# Patient Record
Sex: Female | Born: 1950
Health system: Southern US, Community
[De-identification: ages and names within clinical notes are randomized; demographics above are authoritative.]

## PROBLEM LIST (undated history)

## (undated) DIAGNOSIS — B019 Varicella without complication: Secondary | ICD-10-CM

## (undated) DIAGNOSIS — F17200 Nicotine dependence, unspecified, uncomplicated: Secondary | ICD-10-CM

## (undated) DIAGNOSIS — R112 Nausea with vomiting, unspecified: Secondary | ICD-10-CM

## (undated) DIAGNOSIS — Z9889 Other specified postprocedural states: Secondary | ICD-10-CM

## (undated) DIAGNOSIS — T7840XA Allergy, unspecified, initial encounter: Secondary | ICD-10-CM

## (undated) DIAGNOSIS — K635 Polyp of colon: Secondary | ICD-10-CM

## (undated) DIAGNOSIS — M79672 Pain in left foot: Secondary | ICD-10-CM

## (undated) DIAGNOSIS — T8859XA Other complications of anesthesia, initial encounter: Secondary | ICD-10-CM

## (undated) DIAGNOSIS — M419 Scoliosis, unspecified: Secondary | ICD-10-CM

## (undated) DIAGNOSIS — H269 Unspecified cataract: Secondary | ICD-10-CM

## (undated) DIAGNOSIS — E785 Hyperlipidemia, unspecified: Secondary | ICD-10-CM

## (undated) DIAGNOSIS — T4145XA Adverse effect of unspecified anesthetic, initial encounter: Secondary | ICD-10-CM

## (undated) DIAGNOSIS — Z5189 Encounter for other specified aftercare: Secondary | ICD-10-CM

## (undated) HISTORY — DX: Allergy, unspecified, initial encounter: T78.40XA

## (undated) HISTORY — PX: COLONOSCOPY: SHX174

## (undated) HISTORY — PX: CATARACT EXTRACTION, BILATERAL: SHX1313

## (undated) HISTORY — DX: Varicella without complication: B01.9

## (undated) HISTORY — DX: Hyperlipidemia, unspecified: E78.5

## (undated) HISTORY — PX: OTHER SURGICAL HISTORY: SHX169

## (undated) HISTORY — DX: Encounter for other specified aftercare: Z51.89

## (undated) HISTORY — DX: Unspecified cataract: H26.9

## (undated) HISTORY — DX: Nicotine dependence, unspecified, uncomplicated: F17.200

## (undated) HISTORY — PX: POLYPECTOMY: SHX149

## (undated) HISTORY — DX: Polyp of colon: K63.5

## (undated) HISTORY — PX: EYE SURGERY: SHX253

## (undated) HISTORY — DX: Scoliosis, unspecified: M41.9

## (undated) HISTORY — PX: WISDOM TOOTH EXTRACTION: SHX21

## (undated) HISTORY — DX: Pain in left foot: M79.672

---
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
## 2005-02-24 ENCOUNTER — Ambulatory Visit: Payer: Self-pay | Admitting: Internal Medicine

## 2005-03-30 ENCOUNTER — Ambulatory Visit: Payer: Self-pay | Admitting: Psychology

## 2005-04-23 ENCOUNTER — Ambulatory Visit: Payer: Self-pay | Admitting: Internal Medicine

## 2010-04-22 ENCOUNTER — Telehealth (INDEPENDENT_AMBULATORY_CARE_PROVIDER_SITE_OTHER): Payer: Self-pay | Admitting: *Deleted

## 2010-04-23 DIAGNOSIS — M79609 Pain in unspecified limb: Secondary | ICD-10-CM | POA: Insufficient documentation

## 2010-05-07 ENCOUNTER — Encounter: Payer: Self-pay | Admitting: Internal Medicine

## 2010-05-13 DIAGNOSIS — D126 Benign neoplasm of colon, unspecified: Secondary | ICD-10-CM

## 2010-05-13 DIAGNOSIS — E785 Hyperlipidemia, unspecified: Secondary | ICD-10-CM

## 2010-05-13 DIAGNOSIS — J449 Chronic obstructive pulmonary disease, unspecified: Secondary | ICD-10-CM

## 2010-05-13 DIAGNOSIS — M4120 Other idiopathic scoliosis, site unspecified: Secondary | ICD-10-CM | POA: Insufficient documentation

## 2010-05-15 ENCOUNTER — Ambulatory Visit: Payer: Self-pay | Admitting: Internal Medicine

## 2010-05-15 LAB — CONVERTED CEMR LAB
ALT: 21 units/L (ref 0–35)
Alkaline Phosphatase: 58 units/L (ref 39–117)
BUN: 11 mg/dL (ref 6–23)
Basophils Absolute: 0.1 10*3/uL (ref 0.0–0.1)
Basophils Relative: 0.9 % (ref 0.0–3.0)
CO2: 31 meq/L (ref 19–32)
Cholesterol: 248 mg/dL — ABNORMAL HIGH (ref 0–200)
Eosinophils Absolute: 0.1 10*3/uL (ref 0.0–0.7)
Eosinophils Relative: 1.2 % (ref 0.0–5.0)
GFR calc non Af Amer: 104.31 mL/min (ref >60)
HCT: 43.3 % (ref 36.0–46.0)
HDL: 59.9 mg/dL (ref >39.00)
Hemoglobin, Urine: NEGATIVE
Ketones, ur: NEGATIVE mg/dL
Lymphs Abs: 1.5 10*3/uL (ref 0.7–4.0)
MCHC: 34.2 g/dL (ref 30.0–36.0)
MCV: 93.1 fL (ref 78.0–100.0)
Monocytes Absolute: 0.6 10*3/uL (ref 0.1–1.0)
Neutro Abs: 4.6 10*3/uL (ref 1.4–7.7)
Nitrite: NEGATIVE
Potassium: 4.3 meq/L (ref 3.5–5.1)
Total Bilirubin: 0.7 mg/dL (ref 0.3–1.2)
Total CHOL/HDL Ratio: 4
Total Protein, Urine: NEGATIVE mg/dL
Urine Glucose: NEGATIVE mg/dL
WBC: 6.9 10*3/uL (ref 4.5–10.5)

## 2010-05-16 LAB — CONVERTED CEMR LAB: Vit D, 25-Hydroxy: 56 ng/mL (ref 30–89)

## 2010-11-03 NOTE — Assessment & Plan Note (Signed)
Summary: Primary svc/ cpx    Primary Provider/Referring Provider:  Sherene Sires  CC:  cpx fasting.  History of Present Illness: 60 yowf smoker no meds with h/lo AB seen in 2006   May 15, 2010  1st   office eval  in EMR just finishing meloxicam, not limited by sob or need for any resp rx even during uris.  Pt denies any significant sore throat, dysphagia, itching, sneezing,  nasal congestion or excess secretions,  fever, chills, sweats, unintended wt loss, pleuritic or exertional cp, hempoptysis, change in activity tolerance  orthopnea pnd or leg swelling     Current Medications (verified): 1)  Medroxyprogesterone Acetate 2.5 Mg Tabs (Medroxyprogesterone Acetate) .Marland Kitchen.. 1 Once Daily 2)  Estrace 0.5 Mg Tabs (Estradiol) .Marland Kitchen.. 1 Once Daily  Allergies (verified): 1)  ! Vicodin  Past History:  Past Medical History: p lap Cholecystectomy 11/27/1993 Active smoker     - PFTs 08/01/2003  FEV1 (93%) with ratio 71 [place]     - Rec dexa q 25 months May 15, 2010  Hyperlipidemia     - Target LDL < 130 Pos smoker, cva in father  L foot pain....................................................Marland KitchenVoytek    Health Maintenance.....................................Marland KitchenSherene Sires      - Colonscopy 2004 Medoff      - CPX May 15, 2010      - DT  2004      - Pneumovax 2004 and 2009   Family History: Stroke- Father (in his 47's) Lung ca mother  Social History: Current smoker since age 60. Smokes 1/2 ppd. ETOH- red wine 4 x per wk Divorced Sales rep, moslty sitting behind desk Reg ex  Vital Signs:  Patient profile:   60 year old female Height:      60.5 inches Weight:      145 pounds BMI:     27.95 O2 Sat:      99 % on Room air Temp:     98.8 degrees F oral Pulse rate:   82 / minute BP sitting:   124 / 86  (left arm)  Vitals Entered By: Vernie Murders (May 15, 2010 8:56 AM)  O2 Flow:  Room air  Physical Exam  Additional Exam:  wt 145 May 15, 2010 Ambulatory healthy appearing in no  acute distress. HEENT: nl dentition, turbinates, and orophanx. Nl external ear canals without cough reflex Neck without JVD/Nodes/TM Lungs clear to A and P bilaterally without cough on insp or exp maneuvers RRR no s3 or murmur or increase in P2 Abd soft and benign with nl excursion in the supine position. No bruits or organomegaly Ext warm without calf tenderness, cyanosis clubbing or edema Skin warm and dry without lesions      Cholesterol          [H]  248 mg/dL                   1-610     ATP III Classification            Desirable:  < 200 mg/dL                    Borderline High:  200 - 239 mg/dL               High:  > = 240 mg/dL   Triglycerides             109.0 mg/dL                 9.6-045.0  Normal:  <150 mg/dL     Borderline High:  161 - 199 mg/dL   HDL                       09.60 mg/dL                 >45.40   VLDL Cholesterol          21.8 mg/dL                  9.8-11.9  CHO/HDL Ratio:  CHD Risk                             4                    Men          Women     1/2 Average Risk     3.4          3.3     Average Risk          5.0          4.4     2X Average Risk          9.6          7.1     3X Average Risk          15.0          11.0                           Tests: (2) BMP (METABOL)   Sodium                    144 mEq/L                   135-145   Potassium                 4.3 mEq/L                   3.5-5.1   Chloride                  108 mEq/L                   96-112   Carbon Dioxide            31 mEq/L                    19-32   Glucose                   80 mg/dL                    14-78   BUN                       11 mg/dL                    2-95   Creatinine                0.6 mg/dL                   6.2-1.3   Calcium  9.5 mg/dL                   6.0-45.4   GFR                       104.31 mL/min               >60  Tests: (3) CBC Platelet w/Diff (CBCD)   White Cell Count          6.9 K/uL                    4.5-10.5   Red Cell Count             4.65 Mil/uL                 3.87-5.11   Hemoglobin                14.8 g/dL                   09.8-11.9   Hematocrit                43.3 %                      36.0-46.0   MCV                       93.1 fl                     78.0-100.0   MCHC                      34.2 g/dL                   14.7-82.9   RDW                       14.1 %                      11.5-14.6   Platelet Count            191.0 K/uL                  150.0-400.0   Neutrophil %              66.6 %                      43.0-77.0   Lymphocyte %              22.2 %                      12.0-46.0   Monocyte %                9.1 %                       3.0-12.0   Eosinophils%              1.2 %                       0.0-5.0   Basophils %               0.9 %  0.0-3.0   Neutrophill Absolute      4.6 K/uL                    1.4-7.7   Lymphocyte Absolute       1.5 K/uL                    0.7-4.0   Monocyte Absolute         0.6 K/uL                    0.1-1.0  Eosinophils, Absolute                             0.1 K/uL                    0.0-0.7   Basophils Absolute        0.1 K/uL                    0.0-0.1  Tests: (4) Hepatic/Liver Function Panel (HEPATIC)   Total Bilirubin           0.7 mg/dL                   1.4-7.8   Direct Bilirubin          0.1 mg/dL                   2.9-5.6   Alkaline Phosphatase      58 U/L                      39-117   AST                       20 U/L                      0-37   ALT                       21 U/L                      0-35   Total Protein             6.7 g/dL                    2.1-3.0   Albumin                   3.9 g/dL                    8.6-5.7  Tests: (5) TSH (TSH)   FastTSH                   1.50 uIU/mL                 0.35-5.50  Tests: (6) UDip Only (UDIP)   Color                     LT. YELLOW       RANGE:  Yellow;Lt. Yellow   Clarity                   CLEAR  Clear   Specific Gravity          1.010                        1.000 - 1.030   Urine Ph                  6.0                         5.0-8.0   Protein                   NEGATIVE                    Negative   Urine Glucose             NEGATIVE                    Negative   Ketones                   NEGATIVE                    Negative   Urine Bilirubin           NEGATIVE                    Negative   Blood                     NEGATIVE                    Negative   Urobilinogen              0.2                         0.0 - 1.0   Leukocyte Esterace        NEGATIVE                    Negative   Nitrite                   NEGATIVE                    Negative  Tests: (7) Cholesterol LDL - Direct (DIRLDL)  Cholesterol LDL - Direct                             176.0 mg/dL  CXR  Procedure date:  05/15/2010  Findings:      No active lung disease.  Stable scarring at the left lung base. Stable moderate thoracolumbar scoliosis.  Impression & Recommendations:  Problem # 1:  COPD, MILD (ICD-496) No pft's on file   I took this opportunity to emphasize  the consequences of smoking in airway disorders based on all the data we have from the multiple national lung health studies indicating that smoking cessation, not choice of inhalers or physicians, is the most important aspect of care.    Problem # 2:  HYPERLIPIDEMIA (ICD-272.4)   - Target LDL < 130 Pos smoker, cva in father   LDL 160 > 176 May 15, 2010   rec diet/ ex and recheck in 3 months  Problem # 3:  SCOLIOSIS (ICD-737.30) nees dexa every 25 months vit d level now  Problem #  4:  FOOT PAIN, LEFT (ICD-729.5) f/u voytek  Problem # 5:  Hx of COLONIC POLYPS (ICD-211.3) f/u medoff  Medications Added to Medication List This Visit: 1)  Medroxyprogesterone Acetate 2.5 Mg Tabs (Medroxyprogesterone acetate) .Marland Kitchen.. 1 once daily 2)  Estrace 0.5 Mg Tabs (Estradiol) .Marland Kitchen.. 1 once daily  Other Orders: EKG w/ Interpretation (93000) TLB-Lipid Panel (80061-LIPID) TLB-BMP (Basic Metabolic Panel-BMET)  (80048-METABOL) TLB-CBC Platelet - w/Differential (85025-CBCD) TLB-Hepatic/Liver Function Pnl (80076-HEPATIC) TLB-TSH (Thyroid Stimulating Hormone) (84443-TSH) T-Vitamin D (25-Hydroxy) (16109-60454) TLB-Udip ONLY (81003-UDIP) Gynecologic Referral (Gyn) Pneumococcal Vaccine (09811) Admin 1st Vaccine (91478) New Patient 40-64 years (29562) T-2 View CXR (71020TC)  Patient Instructions: 1)  See Patient Care Coordinator before leaving for refer to Dr Rosemary Holms 2)  Call [phone] for your results w/in next 3 days - if there's something important  I feel you need to know,  I'll be in touch with you directly.  3)  Baby aspirin one daily   CardioPerfect ECG  ID: [id number] Patient: WESTYN, KEATLEY DOB: 16-Jul-1951 Age: 60 Years Old Sex: Female Race: White Physician: Sandrea Hughs PCP: Sandrea Hughs Technician: Boone Master CNA/MA Height: 60.5 Weight: 145 Status: Unconfirmed Recorded: 05/15/2010 09:17 AM P/PR: 112 ms / 165 ms - Heart rate (maximum exercise) QRS: 100 QT/QTc/QTd: 400 ms / 426 ms / 39 ms - Heart rate (maximum exercise)  P/QRS/T axis: 59 deg / 70 deg / 44 deg - Heart rate (maximum exercise)  Heartrate: 75 bpm  Interpretation:   sinus rhythm   Normal ECG       Immunizations Administered:  Pneumonia Vaccine:    Vaccine Type: Pneumovax    Site: left deltoid    Mfr: Merck    Dose: 0.5 ml    Route: IM    Given by: Vernie Murders    Exp. Date: 10/21/2011    Lot #: [id number]    VIS given: 05/01/96 version given May 15, 2010.

## 2010-11-03 NOTE — Letter (Signed)
Summary: [facility]  [facility]   Imported By: Sherian Rein 05/14/2010 14:53:07  _____________________________________________________________________  External Attachment:    Type:   Image     Comment:   External Document

## 2010-11-03 NOTE — Progress Notes (Signed)
Summary: referral req  Phone Note Call from Patient   Caller: Patient Call For: wert Summary of Call: pt c/o of L foot pain / swollen toes/ not red in color however, x 2-3 wks. requests a referral to podiatrist. 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 wk [phone] or cell [phone] Initial call taken by: Tivis Ringer, CNA,  April 22, 2010 3:25 PM  Follow-up for Phone Call        called and spoke with pt ---she stated that she used to work here---she scheduled cpx for 8-12 at 9 am but wanted to know if she would be considered a new pt since her last ov was 2006???  also she has left foot pain x 2-3 weeks with some swelling--wearing a shoe is uncomfortable but she is able to walk.  does she need referral for podiatrist??  please advise. thanks Randell Loop CMA  April 22, 2010 3:38 PM  fine with me but prefer she see an orthopedist - Dr Charlett Blake has a special interest in feet and would be a good choice Follow-up by: Nyoka Cowden MD,  April 22, 2010 4:59 PM  Additional Follow-up for Phone Call Additional follow up Details #1::        LMTCB.Reynaldo Minium CMA  April 22, 2010 5:06 PM   patient returned call.  advised of MW's recs.  patient okay with MW's recommendation of Dr. Charlett Blake.  order placed in EMR. Boone Master CNA/MA  April 23, 2010 9:42 AM   New Problems: FOOT PAIN, LEFT (ICD-729.5)   New Problems: FOOT PAIN, LEFT (ICD-729.5)

## 2011-05-31 ENCOUNTER — Telehealth: Payer: Self-pay | Admitting: Internal Medicine

## 2011-05-31 NOTE — Telephone Encounter (Signed)
lmomtcb to advise Dr. Sherene Sires does not give the shingles but we can order it for her and she get the shot at the pharmacy

## 2011-06-01 NOTE — Telephone Encounter (Signed)
LMTCBx2. Pt needs to set an appt, last seen 05-2010, we can ask MW about giving her shingles rx. Carron Curie, CMA

## 2011-06-02 NOTE — Telephone Encounter (Signed)
lmomtcb  

## 2011-06-03 NOTE — Telephone Encounter (Signed)
Phone number [phone] is not the correct number for this patient according to the gentleman that answered the phone. I have left a msg on the home number that we have listed in the pt's chart.

## 2011-06-08 NOTE — Telephone Encounter (Signed)
Still no call back and so will close encounter per protocol.

## 2011-06-23 ENCOUNTER — Telehealth: Payer: Self-pay | Admitting: Internal Medicine

## 2011-06-23 NOTE — Telephone Encounter (Signed)
She needs appt here for CPX, last seen here 05/15/10- we should sched her appt and then she can discuss shingles vaccine at that time and obtain rx if he is okay with this. LMTCB.

## 2011-06-24 NOTE — Telephone Encounter (Signed)
I attempted to call pt work # listed and it states it was disconnected. i LMOMTCB at # above

## 2011-06-24 NOTE — Telephone Encounter (Signed)
Patient calling back.  Please call work # [phone]

## 2011-06-25 MED ORDER — VARICELLA VIRUS VACCINE LIVE 1350 PFU/0.5ML IJ SUSR: 0.5000 mL | Freq: Once | INTRAMUSCULAR | Status: AC

## 2011-06-25 NOTE — Telephone Encounter (Signed)
Ok with me since we don't give this shot anyway

## 2011-06-25 NOTE — Telephone Encounter (Signed)
Spoke with pt and advised overdue for ov, needs to sched appt. Pt refuses to sched appt stating that she "just can't come in any time soon" and wants [place] to give rx for shingles vaccine. I advised MW out of the office until 07-05-11 and we will forward him msg and contact her with response. She requests we call her work #. Please advise, thanks!

## 2011-06-25 NOTE — Telephone Encounter (Signed)
Let her know won't be back in office until Oct 1 will sign/ send then

## 2011-06-25 NOTE — Telephone Encounter (Signed)
Pt is aware and says the prescription needs to be dated within a week of the injection being given and this will be done on 07/13/2011. We will date the rx for 10/8 for the patient and mail this to her home address once MW sign rx. Home address was verified with the patient.  Prescription printed and placed in MW look at for signature. Envelope already addressed.

## 2011-06-28 ENCOUNTER — Telehealth: Payer: Self-pay | Admitting: Internal Medicine

## 2011-06-28 NOTE — Telephone Encounter (Signed)
Lawson Fiscal already dated rx for 07/12/11 b/c we knew it could not be over 1 wk old. Does she need [place] to redo it again? LMTCB

## 2011-06-28 NOTE — Telephone Encounter (Signed)
Pt aware will send rx to her once signed by MW.

## 2011-06-28 NOTE — Telephone Encounter (Signed)
LMTCB

## 2011-06-30 NOTE — Telephone Encounter (Signed)
lmomtcb  

## 2011-07-01 NOTE — Telephone Encounter (Signed)
lmomtcb  

## 2011-07-09 ENCOUNTER — Telehealth: Payer: Self-pay | Admitting: Internal Medicine

## 2011-07-09 NOTE — Telephone Encounter (Signed)
lmomtcb  

## 2011-07-12 NOTE — Telephone Encounter (Signed)
lmomtcb  

## 2011-07-13 NOTE — Telephone Encounter (Signed)
lmomctb  

## 2011-07-14 NOTE — Telephone Encounter (Signed)
Lmomtcb.  Per protocol, will sign off on this message as we have attempted to contact pt several times but have not been able to reach her.

## 2012-06-26 ENCOUNTER — Encounter: Payer: Self-pay | Admitting: Internal Medicine

## 2012-06-27 ENCOUNTER — Other Ambulatory Visit (INDEPENDENT_AMBULATORY_CARE_PROVIDER_SITE_OTHER): Payer: BC Managed Care – PPO

## 2012-06-27 ENCOUNTER — Ambulatory Visit (INDEPENDENT_AMBULATORY_CARE_PROVIDER_SITE_OTHER)
Admission: RE | Admit: 2012-06-27 | Discharge: 2012-06-27 | Disposition: A | Discharge status: Home / Self Care | Payer: BC Managed Care – PPO | Source: Ambulatory Visit | Attending: Internal Medicine | Admitting: Internal Medicine

## 2012-06-27 ENCOUNTER — Encounter: Payer: Self-pay | Admitting: Internal Medicine

## 2012-06-27 ENCOUNTER — Ambulatory Visit (INDEPENDENT_AMBULATORY_CARE_PROVIDER_SITE_OTHER): Payer: BC Managed Care – PPO | Admitting: Internal Medicine

## 2012-06-27 VITALS — BP 134/88 | HR 82 | Temp 97.8°F | Ht 60.25 in | Wt 131.8 lb

## 2012-06-27 DIAGNOSIS — E785 Hyperlipidemia, unspecified: Secondary | ICD-10-CM

## 2012-06-27 DIAGNOSIS — D126 Benign neoplasm of colon, unspecified: Secondary | ICD-10-CM

## 2012-06-27 DIAGNOSIS — J449 Chronic obstructive pulmonary disease, unspecified: Secondary | ICD-10-CM

## 2012-06-27 DIAGNOSIS — Z0000 Encounter for general adult medical examination without abnormal findings: Secondary | ICD-10-CM

## 2012-06-27 DIAGNOSIS — M4120 Other idiopathic scoliosis, site unspecified: Secondary | ICD-10-CM

## 2012-06-27 DIAGNOSIS — Z23 Encounter for immunization: Secondary | ICD-10-CM

## 2012-06-27 LAB — URINALYSIS
Bilirubin Urine: NEGATIVE
Hgb urine dipstick: NEGATIVE
Nitrite: NEGATIVE
Total Protein, Urine: NEGATIVE
Urine Glucose: NEGATIVE
pH: 5.5 (ref 5.0–8.0)

## 2012-06-27 LAB — CBC WITH DIFFERENTIAL/PLATELET
Basophils Absolute: 0.1 10*3/uL (ref 0.0–0.1)
Eosinophils Relative: 0.8 % (ref 0.0–5.0)
Lymphocytes Relative: 16.4 % (ref 12.0–46.0)
Monocytes Relative: 7.9 % (ref 3.0–12.0)
Neutrophils Relative %: 74.3 % (ref 43.0–77.0)
Platelets: 216 10*3/uL (ref 150.0–400.0)
RDW: 13.9 % (ref 11.5–14.6)
WBC: 9.1 10*3/uL (ref 4.5–10.5)

## 2012-06-27 LAB — BASIC METABOLIC PANEL
BUN: 12 mg/dL (ref 6–23)
Calcium: 9.5 mg/dL (ref 8.4–10.5)
GFR: 81.89 mL/min (ref >60.00)
Potassium: 4 mEq/L (ref 3.5–5.1)
Sodium: 141 mEq/L (ref 135–145)

## 2012-06-27 LAB — HEPATIC FUNCTION PANEL
ALT: 17 U/L (ref 0–35)
AST: 24 U/L (ref 0–37)
Bilirubin, Direct: 0.1 mg/dL (ref 0.0–0.3)
Total Bilirubin: 0.7 mg/dL (ref 0.3–1.2)

## 2012-06-27 LAB — LIPID PANEL
HDL: 53.7 mg/dL (ref >39.00)
Triglycerides: 94 mg/dL (ref 0.0–149.0)
VLDL: 18.8 mg/dL (ref 0.0–40.0)

## 2012-06-27 LAB — LDL CHOLESTEROL, DIRECT: Direct LDL: 164.1 mg/dL

## 2012-06-27 LAB — TSH: TSH: 1.37 u[IU]/mL (ref 0.35–5.50)

## 2012-06-27 NOTE — Patient Instructions (Addendum)
Stopping all smoking is the most important aspect of your health at this point - good luck!  Please see patient coordinator before you leave today  to schedule bone density through [facility]  Please remember to go to the lab and x-ray department downstairs for your tests - we will call you with the results when they are available.

## 2012-06-27 NOTE — Progress Notes (Deleted)
fds

## 2012-06-27 NOTE — Progress Notes (Signed)
Quick Note:  Spoke with pt and notified of results per Dr. Wert. Pt verbalized understanding and denied any questions.  ______ 

## 2012-06-27 NOTE — Assessment & Plan Note (Signed)
-   PFT's ok x min change in mid flows 06/27/2012   GOLD 0 still smoking > discussed.  I reviewed the Flethcher curve with patient that basically indicates  if you quit smoking when your best day FEV1 is still well preserved (as hers is) it is highly unlikely you will progress to severe disease and informed the patient there was no medication on the market that has proven to change the curve or the likelihood of progression.  Therefore stopping smoking and maintaining abstinence is the most important aspect of care, not choice of inhalers or for that matter, doctors.

## 2012-06-27 NOTE — Assessment & Plan Note (Signed)
-   Target LDL < 130 Pos smoker, cva in father   Not at target, rec diet and recheck in 3 m

## 2012-06-27 NOTE — Assessment & Plan Note (Signed)
vid d checked, dexa ordered

## 2012-06-27 NOTE — Progress Notes (Signed)
Subjective:     Patient ID: Alyssa Love, female   DOB: [birth year]  MRN: [id number]  HPI  61 yowf smoker h/o  AB but not requiring any meds chronically.   06/27/2012 f/u ov/Emoni Whitworth for  CPX still smoking but not limted by breathing including aerobics, no need for inhalers, no cough.  Sleeping ok without nocturnal  or early am exacerbation  of respiratory  c/o's or need for noct saba. Also denies any obvious fluctuation of symptoms with weather or environmental changes or other aggravating or alleviating factors except as outlined above.  ROS  The following are not active complaints unless bolded sore throat, dysphagia, dental problems, itching, sneezing,  nasal congestion or excess/ purulent secretions, ear ache,   fever, chills, sweats, unintended wt loss, pleuritic or exertional cp, hemoptysis,  orthopnea pnd or leg swelling, presyncope, palpitations, heartburn, abdominal pain, anorexia, nausea, vomiting, diarrhea  or change in bowel or urinary habits, change in stools or urine, dysuria,hematuria,  rash, arthralgias, visual complaints, headache, numbness weakness or ataxia or problems with walking or coordination,  change in mood/affect or memory.         Past Medical History:  p lap Cholecystectomy 11/27/1993  Active smoker  - PFTs 08/01/2003 FEV1 (93%) with ratio 71  - PFTs 06/27/2012 wnl [place]  - Rec dexa q 25 months May 15, 2010  Hyperlipidemia  - Target LDL < 130 Pos smoker, cva in father  L foot pain....................................................Marland KitchenVoytek  Health Maintenance.....................................Marland KitchenWert  - Colonscopy 2004 Medoff  - CPX 06/27/2012  - DT 2004  And 06/27/2012  - Pneumovax 2004 and 2009    Family History:  Stroke- Father (in his 58's)  Lung ca mother  Melanoma in brother   Social History:  Current smoker since age 76. Smokes 1/2 ppd.  ETOH- red wine 4 x per wk  Divorced  Works out  [employer], moslty sitting behind desk  Reg ex             Review of Systems     Objective:   Physical Exam  wt 145 May 15, 2010  > 131 06/27/2012  Ambulatory healthy appearing in no acute distress.  HEENT: nl dentition, turbinates, and orophanx. Nl external ear canals without cough reflex  Neck without JVD/Nodes/TM  Lungs clear to A and P bilaterally without cough on insp or exp maneuvers  RRR no s3 or murmur or increase in P2  Abd soft and benign with nl excursion in the supine position. No bruits or organomegaly  Ext warm without calf tenderness, cyanosis clubbing or edema  Skin warm and dry without lesions   CXR  06/27/2012 :  No active disease. Stable thoracolumbar scoliosis. Stable left basilar scarring     Assessment:          Plan:

## 2012-06-28 LAB — VITAMIN D 25 HYDROXY (VIT D DEFICIENCY, FRACTURES): Vit D, 25-Hydroxy: 58 ng/mL (ref 30–89)

## 2012-07-05 ENCOUNTER — Telehealth: Payer: Self-pay | Admitting: Internal Medicine

## 2012-07-05 NOTE — Telephone Encounter (Signed)
LMOMTCB x 1 

## 2012-07-06 NOTE — Telephone Encounter (Signed)
Pt is aware and a copy of her labs have been mailed to her home address.

## 2012-07-06 NOTE — Telephone Encounter (Addendum)
Pt would like to know if she is taking enough Vitamin D. She takes 1000 I.U. daily. She would also like a copy of her labs mailed to her home address. I did verify the home address with the pt. Pls advise on amount pts should take daily of vit d.  Msg accidentally closed.

## 2012-07-06 NOTE — Telephone Encounter (Signed)
Whatever she's been taking is perfect, don't change it!

## 2012-07-20 ENCOUNTER — Encounter: Payer: Self-pay | Admitting: Internal Medicine

## 2012-07-20 ENCOUNTER — Other Ambulatory Visit: Payer: Self-pay | Admitting: Internal Medicine

## 2012-07-20 NOTE — Progress Notes (Signed)
Quick Note:  Spoke with pt and notified of results per Dr. Wert. Pt verbalized understanding and denied any questions.  ______ 

## 2012-09-23 ENCOUNTER — Emergency Department (HOSPITAL_COMMUNITY)
Admission: EM | Admit: 2012-09-23 | Discharge: 2012-09-23 | Disposition: A | Discharge status: Home / Self Care | Payer: BC Managed Care – PPO | Attending: Emergency Medicine | Admitting: Emergency Medicine

## 2012-09-23 DIAGNOSIS — Y9389 Activity, other specified: Secondary | ICD-10-CM | POA: Insufficient documentation

## 2012-09-23 DIAGNOSIS — Z79899 Other long term (current) drug therapy: Secondary | ICD-10-CM | POA: Insufficient documentation

## 2012-09-23 DIAGNOSIS — S0181XA Laceration without foreign body of other part of head, initial encounter: Secondary | ICD-10-CM

## 2012-09-23 DIAGNOSIS — M4120 Other idiopathic scoliosis, site unspecified: Secondary | ICD-10-CM | POA: Insufficient documentation

## 2012-09-23 DIAGNOSIS — W010XXA Fall on same level from slipping, tripping and stumbling without subsequent striking against object, initial encounter: Secondary | ICD-10-CM | POA: Insufficient documentation

## 2012-09-23 DIAGNOSIS — E785 Hyperlipidemia, unspecified: Secondary | ICD-10-CM | POA: Insufficient documentation

## 2012-09-23 DIAGNOSIS — Z7982 Long term (current) use of aspirin: Secondary | ICD-10-CM | POA: Insufficient documentation

## 2012-09-23 DIAGNOSIS — Y929 Unspecified place or not applicable: Secondary | ICD-10-CM | POA: Insufficient documentation

## 2012-09-23 DIAGNOSIS — S0120XA Unspecified open wound of nose, initial encounter: Secondary | ICD-10-CM | POA: Insufficient documentation

## 2012-09-23 DIAGNOSIS — F17200 Nicotine dependence, unspecified, uncomplicated: Secondary | ICD-10-CM | POA: Insufficient documentation

## 2012-09-23 NOTE — ED Notes (Signed)
Pt tripped over luggage and hit face on hard wood floors. Pt has laceration  On bridge of nose.

## 2012-09-23 NOTE — ED Provider Notes (Signed)
History     CSN: [id number]  Arrival date & time 09/23/12  1820   First MD Initiated Contact with Patient 09/23/12 1829      Chief Complaint  Patient presents with  . Facial Laceration    (Consider location/radiation/quality/duration/timing/severity/associated sxs/prior treatment) HPI Comments: Patient reports that just prior to arrival she tripped over a suitcase and her face landed on her hard wood floor.  She denies LOC.  She denies vision changes.  Denies headache or neck pain.  Full ROM of all extremities.  She does have a small superficial laceration of the bridge of her nose.  Small amount of bleeding from the area.  She reports that her last Tetanus was one year ago.  The history is provided by the patient.    Past Medical History  Diagnosis Date  . Scoliosis   . Smoker   . Hyperlipidemia   . Left foot pain     Past Surgical History  Procedure Date  . Cholecystectomy 11-27-53    Family History  Problem Relation Age of Onset  . Stroke Father 65  . Lung cancer Mother     History  Substance Use Topics  . Smoking status: Current Every Day Smoker -- 0.5 packs/day    Types: Cigarettes    Start date: 03/24/1968  . Smokeless tobacco: Never Used  . Alcohol Use: 2.4 oz/week    4 Glasses of wine per week    OB History    Grav Para Term Preterm Abortions TAB SAB Ect Mult Living                  Review of Systems  Neurological: Negative for dizziness, syncope, light-headedness and headaches.  All other systems reviewed and are negative.    Allergies  Hydrocodone-acetaminophen  Home Medications   Current Outpatient Rx  Name  Route  Sig  Dispense  Refill  . ASPIRIN EC 81 MG PO TBEC   Oral   Take 81 mg by mouth daily.         Marland Kitchen CALCIUM CARBONATE 600 MG PO TABS   Oral   Take 600 mg by mouth daily.         Marland Kitchen VITAMIN D 1000 UNITS PO TABS   Oral   Take 1,000 Units by mouth daily.         Marland Kitchen ESTRADIOL 0.5 MG PO TABS   Oral   Take 0.5 mg by  mouth daily.         Marland Kitchen MEDROXYPROGESTERONE ACETATE 2.5 MG PO TABS   Oral   Take 2.5 mg by mouth 3 (three) times a week. Mondays, Wednesdays and Saturdays.         Marland Kitchen VITAMIN C 500 MG PO TABS   Oral   Take 500 mg by mouth daily.           There were no vitals taken for this visit.  Physical Exam  Nursing note and vitals reviewed. Constitutional: She appears well-developed and well-nourished. No distress.  HENT:  Head:    Nose: No septal deviation or nasal septal hematoma. No epistaxis.       No bruising or swelling of the nose  Eyes: EOM are normal. Pupils are equal, round, and reactive to light.  Neck: Normal range of motion. Neck supple.  Cardiovascular: Normal rate, regular rhythm and normal heart sounds.   Pulmonary/Chest: Effort normal and breath sounds normal.  Musculoskeletal: Normal range of motion.  Neurological: She is alert.  Skin: Skin  is warm and dry. She is not diaphoretic.       Approximately 1.5-2 cm superficial laceration of the bridge of the nose    ED Course  Procedures (including critical care time)  Labs Reviewed - No data to display No results found.   No diagnosis found.  LACERATION REPAIR Performed by: Anne Shutter, Jathen Sudano Authorized by: Anne Shutter, Herbert Seta Consent: Verbal consent obtained. Risks and benefits: risks, benefits and alternatives were discussed Consent given by: patient Patient identity confirmed: provided demographic data Prepped and Draped in normal sterile fashion Wound explored  Laceration Location: bridge of the nose  Laceration Length: 1.5-2 cm  No Foreign Bodies seen or palpated  Irrigation method: syringe Amount of cleaning: standard  Skin closure: Dermabond   Patient tolerance: Patient tolerated the procedure well with no immediate complications.  MDM  Laceration superficial laceration of the bridge of the nose.  Laceration repaired with Dermabond without complications.        Pascal Lux  [place], PA-C 09/24/12 [phone]

## 2012-09-24 NOTE — ED Provider Notes (Signed)
Medical screening examination/treatment/procedure(s) were performed by non-physician practitioner and as supervising physician I was immediately available for consultation/collaboration.  Devera Englander, MD 09/24/12 2107 

## 2012-10-19 ENCOUNTER — Encounter: Payer: Self-pay | Admitting: Internal Medicine

## 2012-10-20 ENCOUNTER — Encounter: Payer: Self-pay | Admitting: Internal Medicine

## 2013-07-09 IMAGING — CR DG CHEST 2V
2 series · 2 of 2 positions shown · non-contrast
Comparison: 05/15/2010

CLINICAL DATA: COPD and

CHEST - 2 VIEW

[view not recorded (1 of 2)]
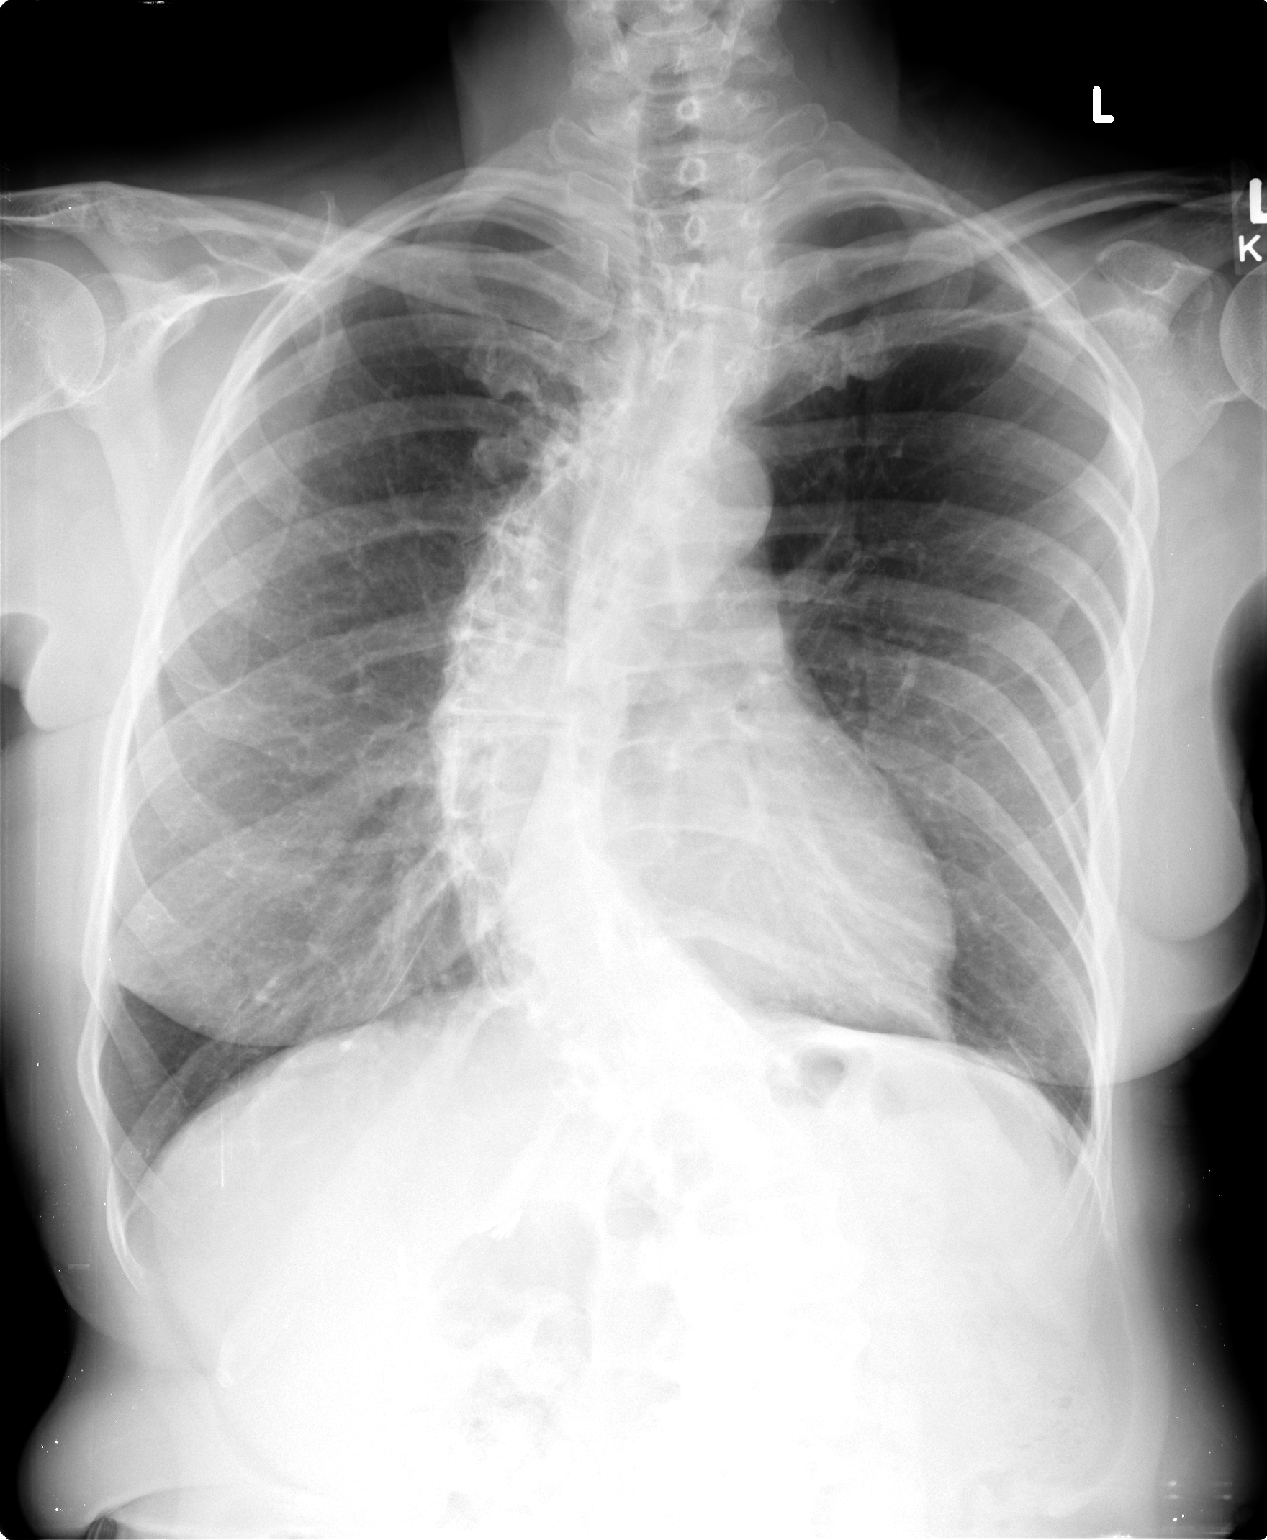

[view not recorded (2 of 2)]
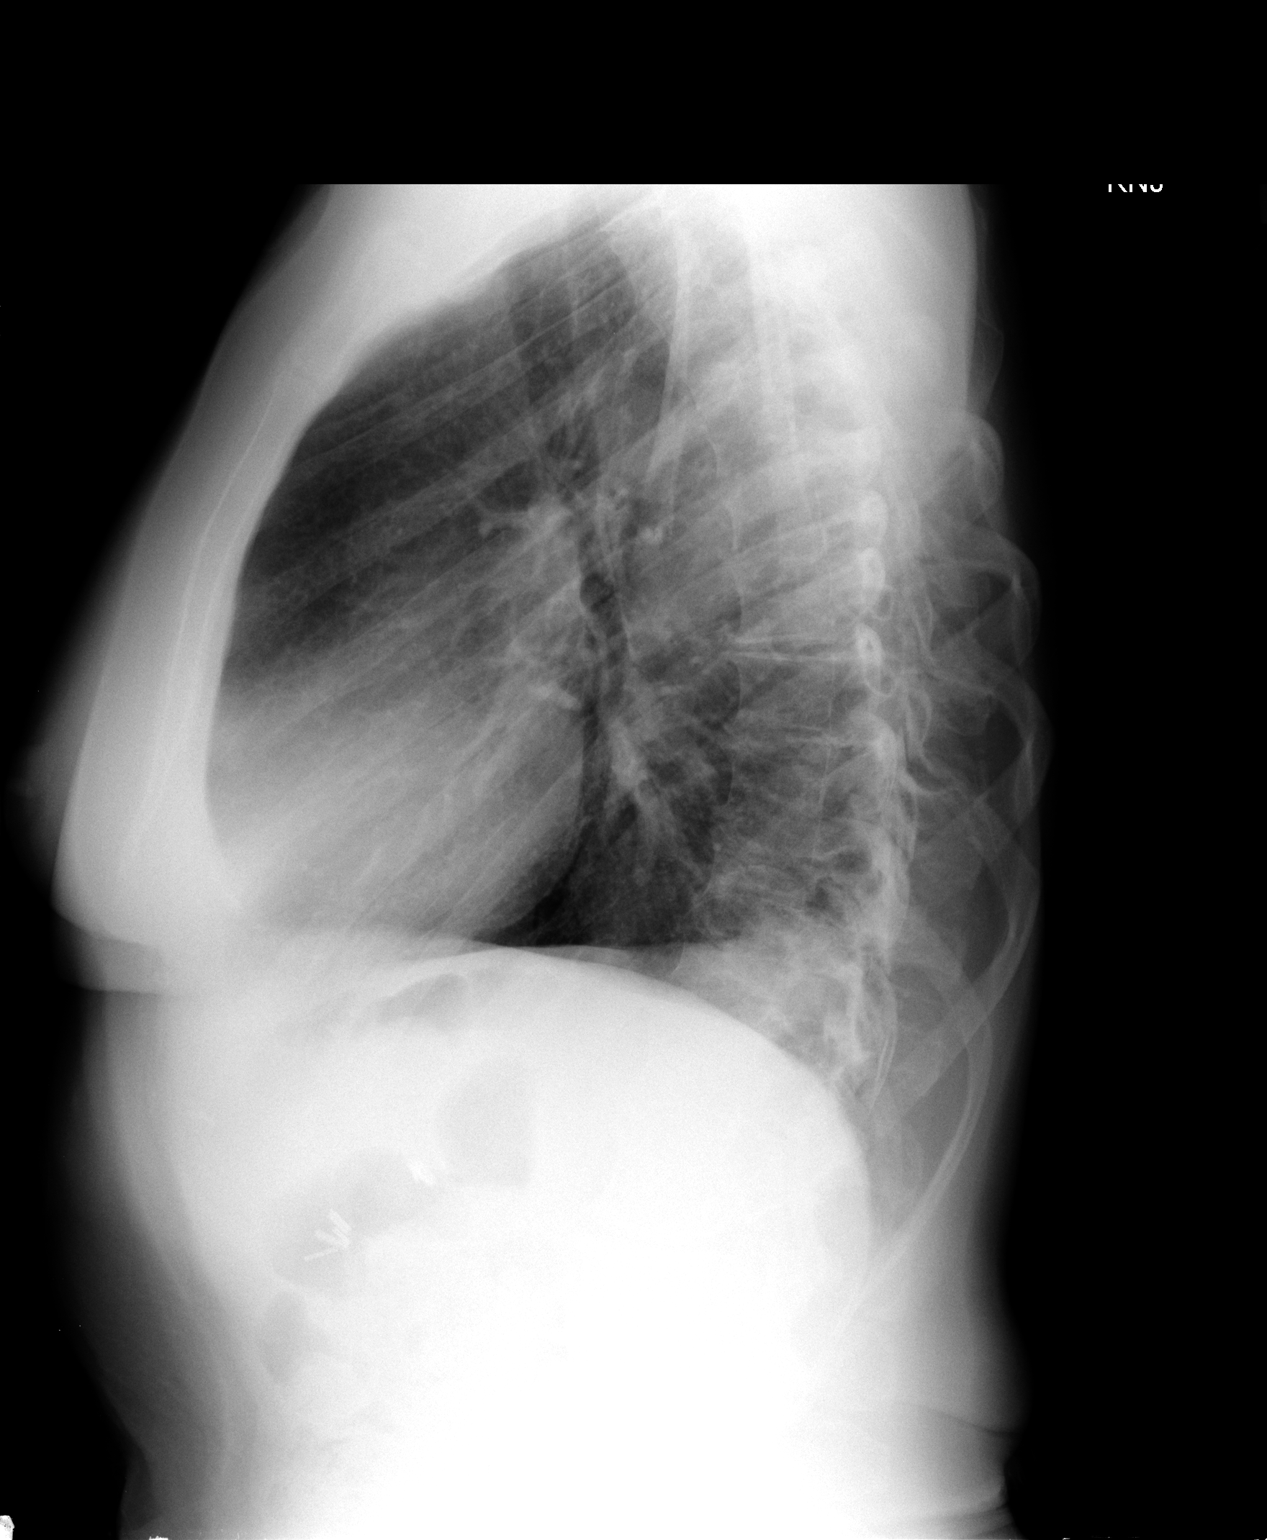

[2 of 2 positions shown; findings below may reference images not displayed]

FINDINGS: Cardiomediastinal silhouette is stable.  Significant
thoracic dextroscoliosis again noted.  No acute infiltrate or
pulmonary edema.  Post cholecystectomy surgical clips are noted.
Stable left base scarring.  Stable levoscoliosis of the lumbar
spine.
IMPRESSION: No active disease.  Stable thoracolumbar scoliosis.  Stable left
basilar scarring.

## 2015-06-29 DIAGNOSIS — S61209A Unspecified open wound of unspecified finger without damage to nail, initial encounter: Secondary | ICD-10-CM | POA: Diagnosis not present

## 2015-06-29 DIAGNOSIS — Z23 Encounter for immunization: Secondary | ICD-10-CM | POA: Diagnosis not present

## 2015-09-18 DIAGNOSIS — Z1231 Encounter for screening mammogram for malignant neoplasm of breast: Secondary | ICD-10-CM | POA: Diagnosis not present

## 2015-09-19 DIAGNOSIS — R87619 Unspecified abnormal cytological findings in specimens from cervix uteri: Secondary | ICD-10-CM | POA: Diagnosis not present

## 2015-09-19 DIAGNOSIS — R87610 Atypical squamous cells of undetermined significance on cytologic smear of cervix (ASC-US): Secondary | ICD-10-CM | POA: Diagnosis not present

## 2015-09-19 DIAGNOSIS — Z1151 Encounter for screening for human papillomavirus (HPV): Secondary | ICD-10-CM | POA: Diagnosis not present

## 2015-09-19 DIAGNOSIS — Z130 Encounter for screening for diseases of the blood and blood-forming organs and certain disorders involving the immune mechanism: Secondary | ICD-10-CM | POA: Diagnosis not present

## 2015-09-19 DIAGNOSIS — Z1389 Encounter for screening for other disorder: Secondary | ICD-10-CM | POA: Diagnosis not present

## 2015-09-19 DIAGNOSIS — Z6827 Body mass index (BMI) 27.0-27.9, adult: Secondary | ICD-10-CM | POA: Diagnosis not present

## 2015-09-19 DIAGNOSIS — Z01419 Encounter for gynecological examination (general) (routine) without abnormal findings: Secondary | ICD-10-CM | POA: Diagnosis not present

## 2015-09-19 DIAGNOSIS — Z124 Encounter for screening for malignant neoplasm of cervix: Secondary | ICD-10-CM | POA: Diagnosis not present

## 2015-10-09 DIAGNOSIS — H6690 Otitis media, unspecified, unspecified ear: Secondary | ICD-10-CM | POA: Diagnosis not present

## 2015-10-09 DIAGNOSIS — J069 Acute upper respiratory infection, unspecified: Secondary | ICD-10-CM | POA: Diagnosis not present

## 2015-11-28 DIAGNOSIS — R87610 Atypical squamous cells of undetermined significance on cytologic smear of cervix (ASC-US): Secondary | ICD-10-CM | POA: Diagnosis not present

## 2015-11-28 DIAGNOSIS — R87619 Unspecified abnormal cytological findings in specimens from cervix uteri: Secondary | ICD-10-CM | POA: Diagnosis not present

## 2016-01-26 DIAGNOSIS — Q72812 Congenital shortening of left lower limb: Secondary | ICD-10-CM | POA: Diagnosis not present

## 2016-01-26 DIAGNOSIS — M5442 Lumbago with sciatica, left side: Secondary | ICD-10-CM | POA: Diagnosis not present

## 2016-01-26 DIAGNOSIS — M9904 Segmental and somatic dysfunction of sacral region: Secondary | ICD-10-CM | POA: Diagnosis not present

## 2016-01-26 DIAGNOSIS — M9903 Segmental and somatic dysfunction of lumbar region: Secondary | ICD-10-CM | POA: Diagnosis not present

## 2016-01-26 DIAGNOSIS — M9905 Segmental and somatic dysfunction of pelvic region: Secondary | ICD-10-CM | POA: Diagnosis not present

## 2016-01-29 DIAGNOSIS — M9904 Segmental and somatic dysfunction of sacral region: Secondary | ICD-10-CM | POA: Diagnosis not present

## 2016-01-29 DIAGNOSIS — M9905 Segmental and somatic dysfunction of pelvic region: Secondary | ICD-10-CM | POA: Diagnosis not present

## 2016-01-29 DIAGNOSIS — M5442 Lumbago with sciatica, left side: Secondary | ICD-10-CM | POA: Diagnosis not present

## 2016-01-29 DIAGNOSIS — M9903 Segmental and somatic dysfunction of lumbar region: Secondary | ICD-10-CM | POA: Diagnosis not present

## 2016-01-29 DIAGNOSIS — Q72812 Congenital shortening of left lower limb: Secondary | ICD-10-CM | POA: Diagnosis not present

## 2016-02-02 DIAGNOSIS — M9903 Segmental and somatic dysfunction of lumbar region: Secondary | ICD-10-CM | POA: Diagnosis not present

## 2016-02-02 DIAGNOSIS — M9905 Segmental and somatic dysfunction of pelvic region: Secondary | ICD-10-CM | POA: Diagnosis not present

## 2016-02-02 DIAGNOSIS — M9904 Segmental and somatic dysfunction of sacral region: Secondary | ICD-10-CM | POA: Diagnosis not present

## 2016-02-02 DIAGNOSIS — Q72812 Congenital shortening of left lower limb: Secondary | ICD-10-CM | POA: Diagnosis not present

## 2016-02-02 DIAGNOSIS — M5442 Lumbago with sciatica, left side: Secondary | ICD-10-CM | POA: Diagnosis not present

## 2016-02-03 DIAGNOSIS — M9904 Segmental and somatic dysfunction of sacral region: Secondary | ICD-10-CM | POA: Diagnosis not present

## 2016-02-03 DIAGNOSIS — M9905 Segmental and somatic dysfunction of pelvic region: Secondary | ICD-10-CM | POA: Diagnosis not present

## 2016-02-03 DIAGNOSIS — M5442 Lumbago with sciatica, left side: Secondary | ICD-10-CM | POA: Diagnosis not present

## 2016-02-03 DIAGNOSIS — M9903 Segmental and somatic dysfunction of lumbar region: Secondary | ICD-10-CM | POA: Diagnosis not present

## 2016-02-03 DIAGNOSIS — Q72812 Congenital shortening of left lower limb: Secondary | ICD-10-CM | POA: Diagnosis not present

## 2016-02-04 DIAGNOSIS — M9905 Segmental and somatic dysfunction of pelvic region: Secondary | ICD-10-CM | POA: Diagnosis not present

## 2016-02-04 DIAGNOSIS — M9904 Segmental and somatic dysfunction of sacral region: Secondary | ICD-10-CM | POA: Diagnosis not present

## 2016-02-04 DIAGNOSIS — M9903 Segmental and somatic dysfunction of lumbar region: Secondary | ICD-10-CM | POA: Diagnosis not present

## 2016-02-04 DIAGNOSIS — Q72812 Congenital shortening of left lower limb: Secondary | ICD-10-CM | POA: Diagnosis not present

## 2016-02-04 DIAGNOSIS — M5442 Lumbago with sciatica, left side: Secondary | ICD-10-CM | POA: Diagnosis not present

## 2016-02-10 DIAGNOSIS — M5442 Lumbago with sciatica, left side: Secondary | ICD-10-CM | POA: Diagnosis not present

## 2016-02-10 DIAGNOSIS — M9904 Segmental and somatic dysfunction of sacral region: Secondary | ICD-10-CM | POA: Diagnosis not present

## 2016-02-10 DIAGNOSIS — M9905 Segmental and somatic dysfunction of pelvic region: Secondary | ICD-10-CM | POA: Diagnosis not present

## 2016-02-10 DIAGNOSIS — Q72812 Congenital shortening of left lower limb: Secondary | ICD-10-CM | POA: Diagnosis not present

## 2016-02-10 DIAGNOSIS — M9903 Segmental and somatic dysfunction of lumbar region: Secondary | ICD-10-CM | POA: Diagnosis not present

## 2016-02-12 DIAGNOSIS — M9905 Segmental and somatic dysfunction of pelvic region: Secondary | ICD-10-CM | POA: Diagnosis not present

## 2016-02-12 DIAGNOSIS — M9903 Segmental and somatic dysfunction of lumbar region: Secondary | ICD-10-CM | POA: Diagnosis not present

## 2016-02-12 DIAGNOSIS — Q72812 Congenital shortening of left lower limb: Secondary | ICD-10-CM | POA: Diagnosis not present

## 2016-02-12 DIAGNOSIS — M5442 Lumbago with sciatica, left side: Secondary | ICD-10-CM | POA: Diagnosis not present

## 2016-02-12 DIAGNOSIS — M9904 Segmental and somatic dysfunction of sacral region: Secondary | ICD-10-CM | POA: Diagnosis not present

## 2016-02-16 DIAGNOSIS — M5442 Lumbago with sciatica, left side: Secondary | ICD-10-CM | POA: Diagnosis not present

## 2016-02-16 DIAGNOSIS — M9903 Segmental and somatic dysfunction of lumbar region: Secondary | ICD-10-CM | POA: Diagnosis not present

## 2016-02-16 DIAGNOSIS — Q72812 Congenital shortening of left lower limb: Secondary | ICD-10-CM | POA: Diagnosis not present

## 2016-02-16 DIAGNOSIS — M9904 Segmental and somatic dysfunction of sacral region: Secondary | ICD-10-CM | POA: Diagnosis not present

## 2016-02-16 DIAGNOSIS — M9905 Segmental and somatic dysfunction of pelvic region: Secondary | ICD-10-CM | POA: Diagnosis not present

## 2016-02-17 DIAGNOSIS — M9903 Segmental and somatic dysfunction of lumbar region: Secondary | ICD-10-CM | POA: Diagnosis not present

## 2016-02-17 DIAGNOSIS — M9904 Segmental and somatic dysfunction of sacral region: Secondary | ICD-10-CM | POA: Diagnosis not present

## 2016-02-17 DIAGNOSIS — M5442 Lumbago with sciatica, left side: Secondary | ICD-10-CM | POA: Diagnosis not present

## 2016-02-17 DIAGNOSIS — Q72812 Congenital shortening of left lower limb: Secondary | ICD-10-CM | POA: Diagnosis not present

## 2016-02-17 DIAGNOSIS — M9905 Segmental and somatic dysfunction of pelvic region: Secondary | ICD-10-CM | POA: Diagnosis not present

## 2016-02-19 DIAGNOSIS — M9903 Segmental and somatic dysfunction of lumbar region: Secondary | ICD-10-CM | POA: Diagnosis not present

## 2016-02-19 DIAGNOSIS — M9904 Segmental and somatic dysfunction of sacral region: Secondary | ICD-10-CM | POA: Diagnosis not present

## 2016-02-19 DIAGNOSIS — Q72812 Congenital shortening of left lower limb: Secondary | ICD-10-CM | POA: Diagnosis not present

## 2016-02-19 DIAGNOSIS — M9905 Segmental and somatic dysfunction of pelvic region: Secondary | ICD-10-CM | POA: Diagnosis not present

## 2016-02-19 DIAGNOSIS — M5442 Lumbago with sciatica, left side: Secondary | ICD-10-CM | POA: Diagnosis not present

## 2016-02-24 DIAGNOSIS — Q72812 Congenital shortening of left lower limb: Secondary | ICD-10-CM | POA: Diagnosis not present

## 2016-02-24 DIAGNOSIS — M9903 Segmental and somatic dysfunction of lumbar region: Secondary | ICD-10-CM | POA: Diagnosis not present

## 2016-02-24 DIAGNOSIS — M5442 Lumbago with sciatica, left side: Secondary | ICD-10-CM | POA: Diagnosis not present

## 2016-02-24 DIAGNOSIS — M9905 Segmental and somatic dysfunction of pelvic region: Secondary | ICD-10-CM | POA: Diagnosis not present

## 2016-02-24 DIAGNOSIS — M9904 Segmental and somatic dysfunction of sacral region: Secondary | ICD-10-CM | POA: Diagnosis not present

## 2016-02-26 DIAGNOSIS — M9904 Segmental and somatic dysfunction of sacral region: Secondary | ICD-10-CM | POA: Diagnosis not present

## 2016-02-26 DIAGNOSIS — M9903 Segmental and somatic dysfunction of lumbar region: Secondary | ICD-10-CM | POA: Diagnosis not present

## 2016-02-26 DIAGNOSIS — Q72812 Congenital shortening of left lower limb: Secondary | ICD-10-CM | POA: Diagnosis not present

## 2016-02-26 DIAGNOSIS — M9905 Segmental and somatic dysfunction of pelvic region: Secondary | ICD-10-CM | POA: Diagnosis not present

## 2016-02-26 DIAGNOSIS — M5442 Lumbago with sciatica, left side: Secondary | ICD-10-CM | POA: Diagnosis not present

## 2016-03-02 DIAGNOSIS — M9905 Segmental and somatic dysfunction of pelvic region: Secondary | ICD-10-CM | POA: Diagnosis not present

## 2016-03-02 DIAGNOSIS — M9904 Segmental and somatic dysfunction of sacral region: Secondary | ICD-10-CM | POA: Diagnosis not present

## 2016-03-02 DIAGNOSIS — M5442 Lumbago with sciatica, left side: Secondary | ICD-10-CM | POA: Diagnosis not present

## 2016-03-02 DIAGNOSIS — M9903 Segmental and somatic dysfunction of lumbar region: Secondary | ICD-10-CM | POA: Diagnosis not present

## 2016-03-02 DIAGNOSIS — Q72812 Congenital shortening of left lower limb: Secondary | ICD-10-CM | POA: Diagnosis not present

## 2016-03-04 DIAGNOSIS — M9905 Segmental and somatic dysfunction of pelvic region: Secondary | ICD-10-CM | POA: Diagnosis not present

## 2016-03-04 DIAGNOSIS — M9904 Segmental and somatic dysfunction of sacral region: Secondary | ICD-10-CM | POA: Diagnosis not present

## 2016-03-04 DIAGNOSIS — Q72812 Congenital shortening of left lower limb: Secondary | ICD-10-CM | POA: Diagnosis not present

## 2016-03-04 DIAGNOSIS — M5442 Lumbago with sciatica, left side: Secondary | ICD-10-CM | POA: Diagnosis not present

## 2016-03-04 DIAGNOSIS — M9903 Segmental and somatic dysfunction of lumbar region: Secondary | ICD-10-CM | POA: Diagnosis not present

## 2016-03-08 DIAGNOSIS — M9905 Segmental and somatic dysfunction of pelvic region: Secondary | ICD-10-CM | POA: Diagnosis not present

## 2016-03-08 DIAGNOSIS — M5442 Lumbago with sciatica, left side: Secondary | ICD-10-CM | POA: Diagnosis not present

## 2016-03-08 DIAGNOSIS — M9904 Segmental and somatic dysfunction of sacral region: Secondary | ICD-10-CM | POA: Diagnosis not present

## 2016-03-08 DIAGNOSIS — Q72812 Congenital shortening of left lower limb: Secondary | ICD-10-CM | POA: Diagnosis not present

## 2016-03-08 DIAGNOSIS — M9903 Segmental and somatic dysfunction of lumbar region: Secondary | ICD-10-CM | POA: Diagnosis not present

## 2016-03-09 DIAGNOSIS — Q72812 Congenital shortening of left lower limb: Secondary | ICD-10-CM | POA: Diagnosis not present

## 2016-03-09 DIAGNOSIS — M9904 Segmental and somatic dysfunction of sacral region: Secondary | ICD-10-CM | POA: Diagnosis not present

## 2016-03-09 DIAGNOSIS — M9903 Segmental and somatic dysfunction of lumbar region: Secondary | ICD-10-CM | POA: Diagnosis not present

## 2016-03-09 DIAGNOSIS — M9905 Segmental and somatic dysfunction of pelvic region: Secondary | ICD-10-CM | POA: Diagnosis not present

## 2016-03-09 DIAGNOSIS — M5442 Lumbago with sciatica, left side: Secondary | ICD-10-CM | POA: Diagnosis not present

## 2016-03-11 DIAGNOSIS — Q72812 Congenital shortening of left lower limb: Secondary | ICD-10-CM | POA: Diagnosis not present

## 2016-03-11 DIAGNOSIS — M9905 Segmental and somatic dysfunction of pelvic region: Secondary | ICD-10-CM | POA: Diagnosis not present

## 2016-03-11 DIAGNOSIS — M5442 Lumbago with sciatica, left side: Secondary | ICD-10-CM | POA: Diagnosis not present

## 2016-03-11 DIAGNOSIS — M9904 Segmental and somatic dysfunction of sacral region: Secondary | ICD-10-CM | POA: Diagnosis not present

## 2016-03-11 DIAGNOSIS — M9903 Segmental and somatic dysfunction of lumbar region: Secondary | ICD-10-CM | POA: Diagnosis not present

## 2016-03-15 DIAGNOSIS — M5442 Lumbago with sciatica, left side: Secondary | ICD-10-CM | POA: Diagnosis not present

## 2016-03-15 DIAGNOSIS — M9903 Segmental and somatic dysfunction of lumbar region: Secondary | ICD-10-CM | POA: Diagnosis not present

## 2016-03-15 DIAGNOSIS — Q72812 Congenital shortening of left lower limb: Secondary | ICD-10-CM | POA: Diagnosis not present

## 2016-03-15 DIAGNOSIS — M9904 Segmental and somatic dysfunction of sacral region: Secondary | ICD-10-CM | POA: Diagnosis not present

## 2016-03-15 DIAGNOSIS — M9905 Segmental and somatic dysfunction of pelvic region: Secondary | ICD-10-CM | POA: Diagnosis not present

## 2016-03-18 DIAGNOSIS — M5442 Lumbago with sciatica, left side: Secondary | ICD-10-CM | POA: Diagnosis not present

## 2016-03-18 DIAGNOSIS — M9903 Segmental and somatic dysfunction of lumbar region: Secondary | ICD-10-CM | POA: Diagnosis not present

## 2016-03-18 DIAGNOSIS — M9905 Segmental and somatic dysfunction of pelvic region: Secondary | ICD-10-CM | POA: Diagnosis not present

## 2016-03-18 DIAGNOSIS — M9904 Segmental and somatic dysfunction of sacral region: Secondary | ICD-10-CM | POA: Diagnosis not present

## 2016-03-18 DIAGNOSIS — Q72812 Congenital shortening of left lower limb: Secondary | ICD-10-CM | POA: Diagnosis not present

## 2016-03-22 DIAGNOSIS — M5442 Lumbago with sciatica, left side: Secondary | ICD-10-CM | POA: Diagnosis not present

## 2016-03-22 DIAGNOSIS — M9904 Segmental and somatic dysfunction of sacral region: Secondary | ICD-10-CM | POA: Diagnosis not present

## 2016-03-22 DIAGNOSIS — M9905 Segmental and somatic dysfunction of pelvic region: Secondary | ICD-10-CM | POA: Diagnosis not present

## 2016-03-22 DIAGNOSIS — M9903 Segmental and somatic dysfunction of lumbar region: Secondary | ICD-10-CM | POA: Diagnosis not present

## 2016-03-22 DIAGNOSIS — Q72812 Congenital shortening of left lower limb: Secondary | ICD-10-CM | POA: Diagnosis not present

## 2016-03-25 DIAGNOSIS — M5442 Lumbago with sciatica, left side: Secondary | ICD-10-CM | POA: Diagnosis not present

## 2016-03-25 DIAGNOSIS — M9903 Segmental and somatic dysfunction of lumbar region: Secondary | ICD-10-CM | POA: Diagnosis not present

## 2016-03-25 DIAGNOSIS — M9904 Segmental and somatic dysfunction of sacral region: Secondary | ICD-10-CM | POA: Diagnosis not present

## 2016-03-25 DIAGNOSIS — M9905 Segmental and somatic dysfunction of pelvic region: Secondary | ICD-10-CM | POA: Diagnosis not present

## 2016-03-25 DIAGNOSIS — Q72812 Congenital shortening of left lower limb: Secondary | ICD-10-CM | POA: Diagnosis not present

## 2016-03-29 DIAGNOSIS — M5442 Lumbago with sciatica, left side: Secondary | ICD-10-CM | POA: Diagnosis not present

## 2016-03-29 DIAGNOSIS — M9903 Segmental and somatic dysfunction of lumbar region: Secondary | ICD-10-CM | POA: Diagnosis not present

## 2016-03-29 DIAGNOSIS — Q72812 Congenital shortening of left lower limb: Secondary | ICD-10-CM | POA: Diagnosis not present

## 2016-03-29 DIAGNOSIS — M9904 Segmental and somatic dysfunction of sacral region: Secondary | ICD-10-CM | POA: Diagnosis not present

## 2016-03-29 DIAGNOSIS — M9905 Segmental and somatic dysfunction of pelvic region: Secondary | ICD-10-CM | POA: Diagnosis not present

## 2016-04-01 DIAGNOSIS — M9903 Segmental and somatic dysfunction of lumbar region: Secondary | ICD-10-CM | POA: Diagnosis not present

## 2016-04-01 DIAGNOSIS — M9904 Segmental and somatic dysfunction of sacral region: Secondary | ICD-10-CM | POA: Diagnosis not present

## 2016-04-01 DIAGNOSIS — M9905 Segmental and somatic dysfunction of pelvic region: Secondary | ICD-10-CM | POA: Diagnosis not present

## 2016-04-01 DIAGNOSIS — M5442 Lumbago with sciatica, left side: Secondary | ICD-10-CM | POA: Diagnosis not present

## 2016-04-01 DIAGNOSIS — Q72812 Congenital shortening of left lower limb: Secondary | ICD-10-CM | POA: Diagnosis not present

## 2016-04-08 DIAGNOSIS — Q72812 Congenital shortening of left lower limb: Secondary | ICD-10-CM | POA: Diagnosis not present

## 2016-04-08 DIAGNOSIS — M5442 Lumbago with sciatica, left side: Secondary | ICD-10-CM | POA: Diagnosis not present

## 2016-04-08 DIAGNOSIS — M9903 Segmental and somatic dysfunction of lumbar region: Secondary | ICD-10-CM | POA: Diagnosis not present

## 2016-04-08 DIAGNOSIS — M9904 Segmental and somatic dysfunction of sacral region: Secondary | ICD-10-CM | POA: Diagnosis not present

## 2016-04-08 DIAGNOSIS — M9905 Segmental and somatic dysfunction of pelvic region: Secondary | ICD-10-CM | POA: Diagnosis not present

## 2016-04-14 DIAGNOSIS — Q72812 Congenital shortening of left lower limb: Secondary | ICD-10-CM | POA: Diagnosis not present

## 2016-04-14 DIAGNOSIS — M9905 Segmental and somatic dysfunction of pelvic region: Secondary | ICD-10-CM | POA: Diagnosis not present

## 2016-04-14 DIAGNOSIS — M5442 Lumbago with sciatica, left side: Secondary | ICD-10-CM | POA: Diagnosis not present

## 2016-04-14 DIAGNOSIS — M9904 Segmental and somatic dysfunction of sacral region: Secondary | ICD-10-CM | POA: Diagnosis not present

## 2016-04-14 DIAGNOSIS — M9903 Segmental and somatic dysfunction of lumbar region: Secondary | ICD-10-CM | POA: Diagnosis not present

## 2016-04-28 DIAGNOSIS — M5442 Lumbago with sciatica, left side: Secondary | ICD-10-CM | POA: Diagnosis not present

## 2016-04-28 DIAGNOSIS — M9905 Segmental and somatic dysfunction of pelvic region: Secondary | ICD-10-CM | POA: Diagnosis not present

## 2016-04-28 DIAGNOSIS — M9903 Segmental and somatic dysfunction of lumbar region: Secondary | ICD-10-CM | POA: Diagnosis not present

## 2016-04-28 DIAGNOSIS — Q72812 Congenital shortening of left lower limb: Secondary | ICD-10-CM | POA: Diagnosis not present

## 2016-04-28 DIAGNOSIS — M9904 Segmental and somatic dysfunction of sacral region: Secondary | ICD-10-CM | POA: Diagnosis not present

## 2016-05-26 DIAGNOSIS — M5442 Lumbago with sciatica, left side: Secondary | ICD-10-CM | POA: Diagnosis not present

## 2016-05-26 DIAGNOSIS — Q72812 Congenital shortening of left lower limb: Secondary | ICD-10-CM | POA: Diagnosis not present

## 2016-05-26 DIAGNOSIS — M9905 Segmental and somatic dysfunction of pelvic region: Secondary | ICD-10-CM | POA: Diagnosis not present

## 2016-05-26 DIAGNOSIS — M9903 Segmental and somatic dysfunction of lumbar region: Secondary | ICD-10-CM | POA: Diagnosis not present

## 2016-05-26 DIAGNOSIS — M9904 Segmental and somatic dysfunction of sacral region: Secondary | ICD-10-CM | POA: Diagnosis not present

## 2016-09-20 DIAGNOSIS — Z803 Family history of malignant neoplasm of breast: Secondary | ICD-10-CM | POA: Diagnosis not present

## 2016-09-20 DIAGNOSIS — Z1231 Encounter for screening mammogram for malignant neoplasm of breast: Secondary | ICD-10-CM | POA: Diagnosis not present

## 2016-09-20 DIAGNOSIS — Z780 Asymptomatic menopausal state: Secondary | ICD-10-CM | POA: Diagnosis not present

## 2016-09-22 DIAGNOSIS — R87610 Atypical squamous cells of undetermined significance on cytologic smear of cervix (ASC-US): Secondary | ICD-10-CM | POA: Diagnosis not present

## 2016-10-10 DIAGNOSIS — J069 Acute upper respiratory infection, unspecified: Secondary | ICD-10-CM | POA: Diagnosis not present

## 2016-12-17 DIAGNOSIS — R87619 Unspecified abnormal cytological findings in specimens from cervix uteri: Secondary | ICD-10-CM | POA: Diagnosis not present

## 2016-12-17 DIAGNOSIS — N879 Dysplasia of cervix uteri, unspecified: Secondary | ICD-10-CM | POA: Diagnosis not present

## 2017-03-01 DIAGNOSIS — J40 Bronchitis, not specified as acute or chronic: Secondary | ICD-10-CM | POA: Diagnosis not present

## 2017-03-01 DIAGNOSIS — J029 Acute pharyngitis, unspecified: Secondary | ICD-10-CM | POA: Diagnosis not present

## 2017-04-29 DIAGNOSIS — L570 Actinic keratosis: Secondary | ICD-10-CM | POA: Diagnosis not present

## 2017-04-29 DIAGNOSIS — D2272 Melanocytic nevi of left lower limb, including hip: Secondary | ICD-10-CM | POA: Diagnosis not present

## 2017-04-29 DIAGNOSIS — L820 Inflamed seborrheic keratosis: Secondary | ICD-10-CM | POA: Diagnosis not present

## 2017-04-29 DIAGNOSIS — L814 Other melanin hyperpigmentation: Secondary | ICD-10-CM | POA: Diagnosis not present

## 2017-04-29 DIAGNOSIS — L821 Other seborrheic keratosis: Secondary | ICD-10-CM | POA: Diagnosis not present

## 2017-09-22 DIAGNOSIS — R6883 Chills (without fever): Secondary | ICD-10-CM | POA: Diagnosis not present

## 2017-09-22 DIAGNOSIS — J019 Acute sinusitis, unspecified: Secondary | ICD-10-CM | POA: Diagnosis not present

## 2017-09-22 DIAGNOSIS — J029 Acute pharyngitis, unspecified: Secondary | ICD-10-CM | POA: Diagnosis not present

## 2017-10-07 DIAGNOSIS — Z780 Asymptomatic menopausal state: Secondary | ICD-10-CM | POA: Diagnosis not present

## 2017-10-07 DIAGNOSIS — Z01419 Encounter for gynecological examination (general) (routine) without abnormal findings: Secondary | ICD-10-CM | POA: Diagnosis not present

## 2017-10-14 DIAGNOSIS — Z1231 Encounter for screening mammogram for malignant neoplasm of breast: Secondary | ICD-10-CM | POA: Diagnosis not present

## 2018-06-15 DIAGNOSIS — H5021 Vertical strabismus, right eye: Secondary | ICD-10-CM | POA: Diagnosis not present

## 2018-06-15 DIAGNOSIS — H5005 Alternating esotropia: Secondary | ICD-10-CM | POA: Diagnosis not present

## 2018-06-27 DIAGNOSIS — R51 Headache: Secondary | ICD-10-CM | POA: Diagnosis not present

## 2018-08-18 ENCOUNTER — Encounter (HOSPITAL_BASED_OUTPATIENT_CLINIC_OR_DEPARTMENT_OTHER): Payer: Self-pay | Admitting: *Deleted

## 2018-08-18 ENCOUNTER — Other Ambulatory Visit: Payer: Self-pay

## 2018-08-21 DIAGNOSIS — H5005 Alternating esotropia: Secondary | ICD-10-CM | POA: Diagnosis not present

## 2018-08-23 ENCOUNTER — Ambulatory Visit: Payer: Self-pay | Admitting: Ophthalmology

## 2018-08-25 ENCOUNTER — Ambulatory Visit (HOSPITAL_BASED_OUTPATIENT_CLINIC_OR_DEPARTMENT_OTHER)
Admission: RE | Admit: 2018-08-25 | Discharge: 2018-08-25 | Disposition: A | Discharge status: Home / Self Care | Payer: Medicare Other | Source: Ambulatory Visit | Attending: Ophthalmology | Admitting: Ophthalmology

## 2018-08-25 ENCOUNTER — Ambulatory Visit: Payer: Self-pay | Admitting: Ophthalmology

## 2018-08-25 ENCOUNTER — Encounter (HOSPITAL_BASED_OUTPATIENT_CLINIC_OR_DEPARTMENT_OTHER): Payer: Self-pay

## 2018-08-25 ENCOUNTER — Ambulatory Visit (HOSPITAL_BASED_OUTPATIENT_CLINIC_OR_DEPARTMENT_OTHER): Payer: Medicare Other | Admitting: Anesthesiology

## 2018-08-25 ENCOUNTER — Encounter (HOSPITAL_BASED_OUTPATIENT_CLINIC_OR_DEPARTMENT_OTHER)
Admission: RE | Disposition: A | Discharge status: Home / Self Care | Payer: Self-pay | Source: Ambulatory Visit | Attending: Ophthalmology

## 2018-08-25 ENCOUNTER — Other Ambulatory Visit: Payer: Self-pay

## 2018-08-25 DIAGNOSIS — H5005 Alternating esotropia: Secondary | ICD-10-CM | POA: Diagnosis not present

## 2018-08-25 DIAGNOSIS — H11441 Conjunctival cysts, right eye: Secondary | ICD-10-CM | POA: Insufficient documentation

## 2018-08-25 DIAGNOSIS — H5000 Unspecified esotropia: Secondary | ICD-10-CM | POA: Insufficient documentation

## 2018-08-25 DIAGNOSIS — Z79899 Other long term (current) drug therapy: Secondary | ICD-10-CM | POA: Diagnosis not present

## 2018-08-25 DIAGNOSIS — E785 Hyperlipidemia, unspecified: Secondary | ICD-10-CM | POA: Diagnosis not present

## 2018-08-25 DIAGNOSIS — F17210 Nicotine dependence, cigarettes, uncomplicated: Secondary | ICD-10-CM | POA: Diagnosis not present

## 2018-08-25 DIAGNOSIS — J449 Chronic obstructive pulmonary disease, unspecified: Secondary | ICD-10-CM | POA: Insufficient documentation

## 2018-08-25 HISTORY — DX: Other specified postprocedural states: Z98.890

## 2018-08-25 HISTORY — PX: STRABISMUS SURGERY: SHX218

## 2018-08-25 HISTORY — DX: Adverse effect of unspecified anesthetic, initial encounter: T41.45XA

## 2018-08-25 HISTORY — DX: Other complications of anesthesia, initial encounter: T88.59XA

## 2018-08-25 HISTORY — PX: CYST EXCISION: SHX5701

## 2018-08-25 HISTORY — DX: Other specified postprocedural states: R11.2

## 2018-08-25 SURGERY — REPAIR STRABISMUS
Anesthesia: General | Site: Eye | Laterality: Right

## 2018-08-25 MED ORDER — ACETAMINOPHEN 160 MG/5ML PO SOLN: 325.0000 mg | ORAL | Status: DC | PRN

## 2018-08-25 MED ORDER — MEPERIDINE HCL 25 MG/ML IJ SOLN: 6.2500 mg | INTRAMUSCULAR | Status: DC | PRN

## 2018-08-25 MED ORDER — PROPOFOL 10 MG/ML IV BOLUS
INTRAVENOUS | Status: AC
  Filled 2018-08-25: qty 20

## 2018-08-25 MED ORDER — TOBRAMYCIN-DEXAMETHASONE 0.3-0.1 % OP OINT
TOPICAL_OINTMENT | OPHTHALMIC | Status: DC | PRN
  Administered 2018-08-25: 1 via OPHTHALMIC

## 2018-08-25 MED ORDER — LACTATED RINGERS IV SOLN
INTRAVENOUS | Status: DC
  Administered 2018-08-25: 10:00:00 via INTRAVENOUS

## 2018-08-25 MED ORDER — MIDAZOLAM HCL 2 MG/2ML IJ SOLN
1.0000 mg | INTRAMUSCULAR | Status: DC | PRN
  Administered 2018-08-25: 2 mg via INTRAVENOUS

## 2018-08-25 MED ORDER — FENTANYL CITRATE (PF) 100 MCG/2ML IJ SOLN
50.0000 ug | INTRAMUSCULAR | Status: AC | PRN
  Administered 2018-08-25 (×2): 25 ug via INTRAVENOUS
  Administered 2018-08-25: 50 ug via INTRAVENOUS

## 2018-08-25 MED ORDER — LIDOCAINE 2% (20 MG/ML) 5 ML SYRINGE
INTRAMUSCULAR | Status: DC | PRN
  Administered 2018-08-25: 60 mg via INTRAVENOUS

## 2018-08-25 MED ORDER — DEXAMETHASONE SODIUM PHOSPHATE 10 MG/ML IJ SOLN
INTRAMUSCULAR | Status: AC
  Filled 2018-08-25: qty 1

## 2018-08-25 MED ORDER — OXYCODONE HCL 5 MG PO TABS: 5.0000 mg | ORAL_TABLET | Freq: Once | ORAL | Status: DC | PRN

## 2018-08-25 MED ORDER — PROPOFOL 10 MG/ML IV BOLUS
INTRAVENOUS | Status: DC | PRN
  Administered 2018-08-25: 120 mg via INTRAVENOUS

## 2018-08-25 MED ORDER — MIDAZOLAM HCL 2 MG/2ML IJ SOLN
INTRAMUSCULAR | Status: AC
  Filled 2018-08-25: qty 2

## 2018-08-25 MED ORDER — EPHEDRINE SULFATE 50 MG/ML IJ SOLN
INTRAMUSCULAR | Status: DC | PRN
  Administered 2018-08-25: 10 mg via INTRAVENOUS

## 2018-08-25 MED ORDER — GLYCOPYRROLATE 0.2 MG/ML IJ SOLN
INTRAMUSCULAR | Status: DC | PRN
  Administered 2018-08-25 (×2): 0.1 mg via INTRAVENOUS

## 2018-08-25 MED ORDER — FENTANYL CITRATE (PF) 100 MCG/2ML IJ SOLN
INTRAMUSCULAR | Status: AC
  Filled 2018-08-25: qty 2

## 2018-08-25 MED ORDER — ONDANSETRON HCL 4 MG/2ML IJ SOLN
INTRAMUSCULAR | Status: DC | PRN
  Administered 2018-08-25: 4 mg via INTRAVENOUS

## 2018-08-25 MED ORDER — FENTANYL CITRATE (PF) 100 MCG/2ML IJ SOLN: 25.0000 ug | INTRAMUSCULAR | Status: DC | PRN

## 2018-08-25 MED ORDER — PHENYLEPHRINE HCL 10 MG/ML IJ SOLN
INTRAMUSCULAR | Status: DC | PRN
  Administered 2018-08-25 (×2): 40 ug via INTRAVENOUS

## 2018-08-25 MED ORDER — ONDANSETRON HCL 4 MG/2ML IJ SOLN: 4.0000 mg | Freq: Once | INTRAMUSCULAR | Status: DC | PRN

## 2018-08-25 MED ORDER — ACETAMINOPHEN 325 MG PO TABS: 325.0000 mg | ORAL_TABLET | ORAL | Status: DC | PRN

## 2018-08-25 MED ORDER — DEXAMETHASONE SODIUM PHOSPHATE 4 MG/ML IJ SOLN
INTRAMUSCULAR | Status: DC | PRN
  Administered 2018-08-25: 10 mg via INTRAVENOUS

## 2018-08-25 MED ORDER — OXYCODONE HCL 5 MG/5ML PO SOLN: 5.0000 mg | Freq: Once | ORAL | Status: DC | PRN

## 2018-08-25 MED ORDER — LIDOCAINE 2% (20 MG/ML) 5 ML SYRINGE
INTRAMUSCULAR | Status: AC
  Filled 2018-08-25: qty 5

## 2018-08-25 MED ORDER — ONDANSETRON HCL 4 MG/2ML IJ SOLN
INTRAMUSCULAR | Status: AC
  Filled 2018-08-25: qty 2

## 2018-08-25 MED ORDER — SCOPOLAMINE 1 MG/3DAYS TD PT72: 1.0000 | MEDICATED_PATCH | Freq: Once | TRANSDERMAL | Status: DC | PRN

## 2018-08-25 MED ORDER — KETOROLAC TROMETHAMINE 30 MG/ML IJ SOLN
INTRAMUSCULAR | Status: DC | PRN
  Administered 2018-08-25: 15 mg via INTRAVENOUS

## 2018-08-25 SURGICAL SUPPLY — 40 items
APPLICATOR COTTON TIP 6 STRL (MISCELLANEOUS) ×8 IMPLANT
APPLICATOR COTTON TIP 6IN STRL (MISCELLANEOUS) ×12
APPLICATOR DR MATTHEWS STRL (MISCELLANEOUS) ×3 IMPLANT
BANDAGE ADH SHEER 1  50/CT (GAUZE/BANDAGES/DRESSINGS) ×3 IMPLANT
BANDAGE COBAN STERILE 2 (GAUZE/BANDAGES/DRESSINGS) IMPLANT
BLADE SURG 15 STRL LF DISP TIS (BLADE) IMPLANT
BLADE SURG 15 STRL SS (BLADE)
BNDG EYE OVAL (GAUZE/BANDAGES/DRESSINGS) IMPLANT
CAUTERY EYE LOW TEMP 1300F FIN (OPHTHALMIC RELATED) IMPLANT
COVER BACK TABLE 60X90IN (DRAPES) ×3 IMPLANT
COVER MAYO STAND STRL (DRAPES) ×3 IMPLANT
COVER WAND RF STERILE (DRAPES) IMPLANT
DECANTER SPIKE VIAL GLASS SM (MISCELLANEOUS) ×3 IMPLANT
DRAPE SURG 17X23 STRL (DRAPES) ×6 IMPLANT
DRAPE U-SHAPE 76X120 STRL (DRAPES) IMPLANT
GLOVE BIO SURGEON STRL SZ 6.5 (GLOVE) ×9 IMPLANT
GLOVE BIOGEL M STRL SZ7.5 (GLOVE) ×6 IMPLANT
GOWN STRL REUS W/ TWL LRG LVL3 (GOWN DISPOSABLE) ×2 IMPLANT
GOWN STRL REUS W/TWL LRG LVL3 (GOWN DISPOSABLE) ×1
GOWN STRL REUS W/TWL XL LVL3 (GOWN DISPOSABLE) ×3 IMPLANT
NEEDLE HYPO 30X.5 LL (NEEDLE) ×3 IMPLANT
NS IRRIG 1000ML POUR BTL (IV SOLUTION) ×3 IMPLANT
PACK BASIN DAY SURGERY FS (CUSTOM PROCEDURE TRAY) ×3 IMPLANT
SHEET MEDIUM DRAPE 40X70 STRL (DRAPES) ×3 IMPLANT
SPEAR EYE SURG WECK-CEL (MISCELLANEOUS) ×6 IMPLANT
STRIP CLOSURE SKIN 1/4X4 (GAUZE/BANDAGES/DRESSINGS) IMPLANT
SUT 6 0 SILK T G140 8DA (SUTURE) ×3 IMPLANT
SUT MERSILENE 6-0 18IN S14 8MM (SUTURE)
SUT PLAIN 6 0 TG1408 (SUTURE) ×3 IMPLANT
SUT SILK 4 0 C 3 735G (SUTURE) IMPLANT
SUT SILK 4 0 P 3 (SUTURE) IMPLANT
SUT VICRYL 6 0 S 28 (SUTURE) ×3 IMPLANT
SUT VICRYL ABS 6-0 S29 18IN (SUTURE) ×3 IMPLANT
SUTURE MERSLN 6-0 18IN S14 8MM (SUTURE) IMPLANT
SYR 10ML LL (SYRINGE) ×3 IMPLANT
SYR 3ML 18GX1 1/2 (SYRINGE) ×3 IMPLANT
SYR TB 1ML LL NO SAFETY (SYRINGE) ×3 IMPLANT
TOWEL GREEN STERILE FF (TOWEL DISPOSABLE) ×3 IMPLANT
TOWEL OR NON WOVEN STRL DISP B (DISPOSABLE) ×3 IMPLANT
TRAY DSU PREP LF (CUSTOM PROCEDURE TRAY) ×3 IMPLANT

## 2018-08-25 NOTE — H&P (Signed)
Date of examination:  08-21-18  Indication for surgery: to straighten the eyes and allow some binocularity  Pertinent past medical history:  Past Medical History:  Diagnosis Date  . Complication of anesthesia   . Hyperlipidemia   . Left foot pain   . PONV (postoperative nausea and vomiting)   . Scoliosis   . Smoker     Pertinent ocular history:  S/p strabismus surgery x3, beginning at 67 years of age.  OS turns in x 2 or more years.  Hx monovision.  Hx conj surgery for cysts   Pertinent family history:  Family History  Problem Relation Age of Onset  . Stroke Father 64  . Lung cancer Mother     General:  Healthy appearing patient in no distress.    Eyes:    Acuity [place]  OD 20/20  OS 20/70  External: Within normal limits     Anterior segment: Within normal limits multiple conj cysts OD  Motility:   ET=35, increases in R gaze, L HoT 12, ET'35, LHoT'101- abduction OD  Fundus: deferred  Refraction:  OS myopic  Heart: Regular rate and rhythm without murmur     Lungs: Clear to auscultation     Impression:Estropia, ? Residual/recurrent vs. Consecutive, s/p strabismus surgery x 3, details unknown  Horizontal incomitance, with sl lim of abduction OS  Conj cysts OD  Plan: Large left medial rectus muscle recession only:  Goal is to improve the primary position esotropia and the horizontal incomitance.  No vertical surgery for now.  Will excise conj cysts OD  Derry Skill

## 2018-08-25 NOTE — Op Note (Signed)
08/25/2018  11:33 AM  PATIENT:  Alyssa Love Dec  67 y.o. female  PRE-OPERATIVE DIAGNOSIS: 1)  Esotropia, residual vs.recurrent vs. Consecutive, horizontally incomitant        2) Conjunctival cysts right eye        3) History of previous strabismus surgery x 3, details unknown  POST-OPERATIVE DIAGNOSIS:  Same  PROCEDURE:  1) Medial rectus muscle recession  7.0 mm left eye   2) Excision of conjunctival cysts, right eye  SURGEON:  Lorne Skeens.Annamaria Boots, M.D.   ANESTHESIA:   general  COMPLICATIONS:None  DESCRIPTION OF PROCEDURE: The patient was taken to the operating room where She was identified by me. General anesthesia was induced without difficulty after placement of appropriate monitors. The patient was prepped and draped in standard sterile fashion. A lid speculum was placed in the left eye.  Forced duction felt free to forced abduction, adduction, and elevation of the left eye.  Through an inferonasal fornix incision through conjunctiva and Tenon's fascia, the left medial rectus muscle was engaged on a series of muscle hooks and cleared of its fascial attachments. The tendon was secured with a double-armed 6-0 Vicryl suture with a double locking bite at each border of the muscle, 1 mm from the insertion. The muscle was disinserted, and was reattached to sclera at a measured distance of 7.0 millimeters posterior to the original insertion, using direct scleral passes in crossed swords fashion.  The suture ends were tied securely after the position of the muscle had been checked and found to be accurate. Conjunctiva was closed with 1 6-0 plain gut suture.  The speculum was transferred to the right eye, where 5 conjunctival cysts were identified.  The largest, measuring approximately 3 mm in diameter, was in the inferonasal fornix.  The remaining cysts each measured approximately 1 mm in diameter: 1 in the inferonasal bulbar conjunctiva, superior to the larger cyst; 2 adjacent to the superonasal  limbus, and 1 just posterior to the superior limbus.  Each of the cysts was excised with W[place]ott scissors.  A single 6-0 plain gut suture was used to close the incision resulting from the excision of the largest cyst.  The others were left open.  TobraDex ointment was placed in both eye(s). The patient was awakened without difficulty and taken to the recovery room in stable condition, having suffered no intraoperative or immediate postoperative complications.  Lorne Skeens. Josten Warmuth M.D.    PATIENT DISPOSITION:  PACU - hemodynamically stable.

## 2018-08-25 NOTE — Transfer of Care (Signed)
Immediate Anesthesia Transfer of Care Note  Patient: Alyssa Love  Procedure(s) Performed: REPAIR STRABISMUS LEFT EYE (Left Eye) EXCISE CONJUNCTIVAL CYST (Right Eye)  Patient Location: PACU  Anesthesia Type:General  Level of Consciousness: sedated  Airway & Oxygen Therapy: Patient Spontanous Breathing and Patient connected to face mask oxygen  Post-op Assessment: Report given to RN and Post -op Vital signs reviewed and stable  Post vital signs: Reviewed and stable  Last Vitals:  Vitals Value Taken Time  BP 107/65 08/25/2018 11:35 AM  Temp    Pulse 102 08/25/2018 11:38 AM  Resp 16 08/25/2018 11:38 AM  SpO2 96 % 08/25/2018 11:38 AM  Vitals shown include unvalidated device data.  Last Pain:  Vitals:   08/25/18 1011  TempSrc: Oral  PainSc: 0-No pain         Complications: No apparent anesthesia complications

## 2018-08-25 NOTE — Anesthesia Postprocedure Evaluation (Signed)
Anesthesia Post Note  Patient: Alyssa Love  Procedure(s) Performed: REPAIR STRABISMUS LEFT EYE (Left Eye) EXCISE CONJUNCTIVAL CYST (Right Eye)     Patient location during evaluation: PACU Anesthesia Type: General Level of consciousness: awake and alert Pain management: pain level controlled Vital Signs Assessment: post-procedure vital signs reviewed and stable Respiratory status: spontaneous breathing, nonlabored ventilation, respiratory function stable and patient connected to nasal cannula oxygen Cardiovascular status: blood pressure returned to baseline and stable Postop Assessment: no apparent nausea or vomiting Anesthetic complications: no    Last Vitals:  Vitals:   08/25/18 1208 08/25/18 1215  BP:  129/81  Pulse: 86 86  Resp: 14 18  Temp:  36.6 C  SpO2: 95% 96%    Last Pain:  Vitals:   08/25/18 1215  TempSrc:   PainSc: 2                  Amantha Sklar

## 2018-08-25 NOTE — Anesthesia Procedure Notes (Signed)
Procedure Name: LMA Insertion Date/Time: 08/25/2018 10:36 AM Performed by: Maryella Shivers, CRNA Pre-anesthesia Checklist: Patient identified, Emergency Drugs available, Suction available and Patient being monitored Patient Re-evaluated:Patient Re-evaluated prior to induction Oxygen Delivery Method: Circle system utilized Preoxygenation: Pre-oxygenation with 100% oxygen Induction Type: IV induction Ventilation: Mask ventilation without difficulty LMA: LMA flexible inserted LMA Size: 4.0 Number of attempts: 1 Airway Equipment and Method: Bite block Placement Confirmation: positive ETCO2 Tube secured with: Tape Dental Injury: Teeth and Oropharynx as per pre-operative assessment

## 2018-08-25 NOTE — Discharge Instructions (Signed)
Diet: Clear liquids, advance to soft foods then regular diet as tolerated by this evening.  Pain control:   1)  Ibuprofen 600 mg by mouth every 6-8 hours as needed for pain, none until after 5 pm today.  2)  Ice pack/cold compress to operated eye(s) as desired  Eye medications:   Tobradex or Zylet eye ointment 1/2 inch in operated eye(s) twice a day for one week  Activity: No swimming for 1 week.  It is OK to let water run over the face and eyes while showering or taking a bath, even during the first week.  No other restriction on exercise or activity.   Call Dr. Janee Morn office [phone] with any problems or concerns.   Post Anesthesia Home Care Instructions  Activity: Get plenty of rest for the remainder of the day. A responsible individual must stay with you for 24 hours following the procedure.  For the next 24 hours, DO NOT: -Drive a car -Paediatric nurse -Drink alcoholic beverages -Take any medication unless instructed by your physician -Make any legal decisions or sign important papers.  Meals: Start with liquid foods such as gelatin or soup. Progress to regular foods as tolerated. Avoid greasy, spicy, heavy foods. If nausea and/or vomiting occur, drink only clear liquids until the nausea and/or vomiting subsides. Call your physician if vomiting continues.  Special Instructions/Symptoms: Your throat may feel dry or sore from the anesthesia or the breathing tube placed in your throat during surgery. If this causes discomfort, gargle with warm salt water. The discomfort should disappear within 24 hours.  If you had a scopolamine patch placed behind your ear for the management of post- operative nausea and/or vomiting:  1. The medication in the patch is effective for 72 hours, after which it should be removed.  Wrap patch in a tissue and discard in the trash. Wash hands thoroughly with soap and water. 2. You may remove the patch earlier than 72 hours if you experience  unpleasant side effects which may include dry mouth, dizziness or visual disturbances. 3. Avoid touching the patch. Wash your hands with soap and water after contact with the patch.

## 2018-08-25 NOTE — Anesthesia Preprocedure Evaluation (Signed)
Anesthesia Evaluation  Patient identified by MRN, date of birth, ID band Patient awake    Reviewed: Allergy & Precautions, H&P , NPO status , Patient's Chart, lab work & pertinent test results, reviewed documented beta blocker date and time   History of Anesthesia Complications (+) PONV and history of anesthetic complications  Airway Mallampati: II  TM Distance: >3 FB Neck ROM: full    Dental no notable dental hx.    Pulmonary neg pulmonary ROS, COPD, Current Smoker,    Pulmonary exam normal breath sounds clear to auscultation       Cardiovascular Exercise Tolerance: Good negative cardio ROS   Rhythm:regular Rate:Normal     Neuro/Psych negative neurological ROS  negative psych ROS   GI/Hepatic negative GI ROS, Neg liver ROS,   Endo/Other  negative endocrine ROS  Renal/GU negative Renal ROS  negative genitourinary   Musculoskeletal   Abdominal   Peds  Hematology negative hematology ROS (+)   Anesthesia Other Findings   Reproductive/Obstetrics negative OB ROS                             Anesthesia Physical Anesthesia Plan  ASA: II  Anesthesia Plan: General   Post-op Pain Management:    Induction:   PONV Risk Score and Plan: 4 or greater and Dexamethasone, Treatment may vary due to age or medical condition, Ondansetron, Propofol infusion and Midazolam  Airway Management Planned: Oral ETT and LMA  Additional Equipment:   Intra-op Plan:   Post-operative Plan: Extubation in OR  Informed Consent: I have reviewed the patients History and Physical, chart, labs and discussed the procedure including the risks, benefits and alternatives for the proposed anesthesia with the patient or authorized representative who has indicated his/her understanding and acceptance.   Dental Advisory Given  Plan Discussed with: CRNA, Anesthesiologist and Surgeon  Anesthesia Plan Comments:          Anesthesia Quick Evaluation

## 2018-08-28 ENCOUNTER — Encounter (HOSPITAL_BASED_OUTPATIENT_CLINIC_OR_DEPARTMENT_OTHER): Payer: Self-pay | Admitting: Ophthalmology

## 2018-10-11 ENCOUNTER — Telehealth: Payer: Self-pay | Admitting: General Practice

## 2018-10-11 NOTE — Telephone Encounter (Signed)
Called pt and left vm to call and make np appt   Copied from [place] [id number]. Topic: Appointment Scheduling - Scheduling Inquiry for Clinic >> Oct 10, 2018 11:09 AM Lennox Solders wrote: Reason for CRM:pt would like to est with dr Juleen China. Pt has [facility]are. Per one note dr Juleen China does one new pt per session I am unable to schedule for afternoon. Please call pt

## 2018-10-24 DIAGNOSIS — N644 Mastodynia: Secondary | ICD-10-CM | POA: Diagnosis not present

## 2018-10-24 DIAGNOSIS — Z803 Family history of malignant neoplasm of breast: Secondary | ICD-10-CM | POA: Diagnosis not present

## 2018-10-27 DIAGNOSIS — N951 Menopausal and female climacteric states: Secondary | ICD-10-CM | POA: Diagnosis not present

## 2018-10-30 DIAGNOSIS — R87612 Low grade squamous intraepithelial lesion on cytologic smear of cervix (LGSIL): Secondary | ICD-10-CM | POA: Diagnosis not present

## 2018-11-12 NOTE — Progress Notes (Signed)
Subjective:    Alyssa Love is a 68 y.o. female and is here for a comprehensive physical exam.  Health Maintenance Due  Topic Date Due  . Hepatitis C Screening  [birth year]  . MAMMOGRAM  09/21/2014  . DEXA SCAN  03/25/2015  . PNA vac Low Risk Adult (1 of 2 - PCV13) 03/25/2015     Current Outpatient Medications:  .  Ascorbic Acid (VITAMIN C) 100 MG tablet, Vitamin C, Disp: , Rfl:  .  aspirin EC 81 MG tablet, Take 81 mg by mouth daily., Disp: , Rfl:  .  calcium carbonate (OS-CAL) 600 MG TABS, Take 600 mg by mouth daily., Disp: , Rfl:  .  cholecalciferol (VITAMIN D) 1000 UNITS tablet, Take 1,000 Units by mouth daily., Disp: , Rfl:  .  Omega-3 Fatty Acids (FISH OIL) 1000 MG CAPS, Fish Oil, Disp: , Rfl:  .  vitamin C (ASCORBIC ACID) 500 MG tablet, Take 500 mg by mouth daily., Disp: , Rfl:  .  Vitamin E 100 UNIT/GM CREA, vitamin E, Disp: , Rfl:   PMHx, SurgHx, SocialHx, Medications, and Allergies were reviewed in the Visit Navigator and updated as appropriate.   Past Medical History:  Diagnosis Date  . Chicken pox   . Colon polyp   . Complication of anesthesia   . Hyperlipidemia   . Left foot pain   . PONV (postoperative nausea and vomiting)   . Scoliosis   . Smoker      Past Surgical History:  Procedure Laterality Date  . CHOLECYSTECTOMY  11-27-53  . CYST EXCISION Right 08/25/2018   Procedure: EXCISE CONJUNCTIVAL CYST;  Surgeon: Everitt Amber, MD;  Location: [place];  Service: Ophthalmology;  Laterality: Right;  . STRABISMUS SURGERY Left 08/25/2018   Procedure: REPAIR STRABISMUS LEFT EYE;  Surgeon: Everitt Amber, MD;  Location: [place] [facility];  Service: Ophthalmology;  Laterality: Left;     Family History  Problem Relation Age of Onset  . Stroke Father 64  . Prostate cancer Father   . Hypertension Father   . Lung cancer Mother   . Cancer Maternal Grandmother   . Cancer Paternal Grandmother   . Heart attack Paternal Grandfather       Social History   Tobacco Use  . Smoking status: Current Every Day Smoker    Packs/day: 0.50    Types: Cigarettes    Start date: 03/24/1968  . Smokeless tobacco: Never Used  Substance Use Topics  . Alcohol use: Yes    Alcohol/week: 4.0 standard drinks    Types: 4 Glasses of wine per week  . Drug use: No    Review of Systems:   Pertinent items are noted in the HPI. Otherwise, ROS is negative.  Objective:   BP 126/82   Pulse 85   Temp 98.7 F (37.1 C) (Oral)   Ht 5\' 2"  (1.575 m)   Wt 142 lb 6.4 oz (64.6 kg)   SpO2 98%   BMI 26.05 kg/m   General appearance: alert, cooperative and appears stated age. Head: normocephalic, without obvious abnormality, atraumatic. Neck: no adenopathy, supple, symmetrical, trachea midline; thyroid not enlarged, symmetric, no tenderness/mass/nodules. Lungs: clear to auscultation bilaterally. Heart: regular rate and rhythm Abdomen: soft, non-tender; no masses,  no organomegaly. Extremities: extremities normal, atraumatic, no cyanosis or edema. Skin: skin color, texture, turgor normal, no rashes or lesions. Lymph: cervical, supraclavicular, and axillary nodes normal; no abnormal inguinal nodes palpated. Neurologic: grossly normal.       Assessment/Plan:  Alyssa Love was seen today for establish care.  Diagnoses and all orders for this visit:  Routine general medical examination at a health care facility  Vitamin D deficiency -     VITAMIN D 25 Hydroxy (Vit-D Deficiency, Fractures)  Other fatigue -     CBC with Differential/Platelet -     Comprehensive metabolic panel -     TSH -     VITAMIN D 25 Hydroxy (Vit-D Deficiency, Fractures)  Hyperlipidemia, unspecified hyperlipidemia type -     Lipid panel    Patient Counseling: [x]    Nutrition: Stressed importance of moderation in sodium/caffeine intake, saturated fat and cholesterol, caloric balance, sufficient intake of fresh fruits, vegetables, fiber, calcium, iron, and 1 mg of  folate supplement per day (for females capable of pregnancy).  [x]    Stressed the importance of regular exercise.   [x]    Substance Abuse: Discussed cessation/primary prevention of tobacco, alcohol, or other drug use; driving or other dangerous activities under the influence; availability of treatment for abuse.   [x]    Injury prevention: Discussed safety belts, safety helmets, smoke detector, smoking near bedding or upholstery.   [x]    Sexuality: Discussed sexually transmitted diseases, partner selection, use of condoms, avoidance of unintended pregnancy  and contraceptive alternatives.  [x]    Dental health: Discussed importance of regular tooth brushing, flossing, and dental visits.  [x]    Health maintenance and immunizations reviewed. Please refer to Health maintenance section.   Briscoe Deutscher, DO [place]

## 2018-11-13 ENCOUNTER — Encounter: Payer: Self-pay | Admitting: Family Medicine

## 2018-11-13 ENCOUNTER — Ambulatory Visit (INDEPENDENT_AMBULATORY_CARE_PROVIDER_SITE_OTHER): Payer: Medicare Other | Admitting: Family Medicine

## 2018-11-13 VITALS — BP 126/82 | HR 85 | Temp 98.7°F | Ht 62.0 in | Wt 142.4 lb

## 2018-11-13 DIAGNOSIS — E785 Hyperlipidemia, unspecified: Secondary | ICD-10-CM | POA: Diagnosis not present

## 2018-11-13 DIAGNOSIS — E559 Vitamin D deficiency, unspecified: Secondary | ICD-10-CM | POA: Diagnosis not present

## 2018-11-13 DIAGNOSIS — R5383 Other fatigue: Secondary | ICD-10-CM | POA: Diagnosis not present

## 2018-11-13 DIAGNOSIS — Z0000 Encounter for general adult medical examination without abnormal findings: Secondary | ICD-10-CM | POA: Diagnosis not present

## 2018-11-14 LAB — COMPREHENSIVE METABOLIC PANEL
ALT: 15 U/L (ref 0–35)
AST: 17 U/L (ref 0–37)
Albumin: 4.2 g/dL (ref 3.5–5.2)
Alkaline Phosphatase: 100 U/L (ref 39–117)
BUN: 12 mg/dL (ref 6–23)
CO2: 27 mEq/L (ref 19–32)
Calcium: 9.6 mg/dL (ref 8.4–10.5)
Chloride: 103 mEq/L (ref 96–112)
Creatinine, Ser: 0.6 mg/dL (ref 0.40–1.20)
GFR: 99.23 mL/min (ref >60.00)
Glucose, Bld: 94 mg/dL (ref 70–99)
Potassium: 4 mEq/L (ref 3.5–5.1)
Sodium: 139 mEq/L (ref 135–145)
Total Bilirubin: 0.3 mg/dL (ref 0.2–1.2)
Total Protein: 6.8 g/dL (ref 6.0–8.3)

## 2018-11-14 LAB — LIPID PANEL
Cholesterol: 262 mg/dL — ABNORMAL HIGH (ref 0–200)
HDL: 51.6 mg/dL (ref >39.00)
LDL Cholesterol: 172 mg/dL — ABNORMAL HIGH (ref 0–99)
NonHDL: 210.18
Total CHOL/HDL Ratio: 5
Triglycerides: 189 mg/dL — ABNORMAL HIGH (ref 0.0–149.0)
VLDL: 37.8 mg/dL (ref 0.0–40.0)

## 2018-11-14 LAB — CBC WITH DIFFERENTIAL/PLATELET
Basophils Absolute: 0.1 10*3/uL (ref 0.0–0.1)
Basophils Relative: 1.4 % (ref 0.0–3.0)
Eosinophils Absolute: 0.1 10*3/uL (ref 0.0–0.7)
Eosinophils Relative: 1.6 % (ref 0.0–5.0)
HCT: 43.3 % (ref 36.0–46.0)
Hemoglobin: 14.4 g/dL (ref 12.0–15.0)
Lymphocytes Relative: 28 % (ref 12.0–46.0)
Lymphs Abs: 2.1 10*3/uL (ref 0.7–4.0)
MCHC: 33.2 g/dL (ref 30.0–36.0)
MCV: 90.1 fl (ref 78.0–100.0)
Monocytes Absolute: 1 10*3/uL (ref 0.1–1.0)
Monocytes Relative: 13.1 % — ABNORMAL HIGH (ref 3.0–12.0)
Neutro Abs: 4.2 10*3/uL (ref 1.4–7.7)
Neutrophils Relative %: 55.9 % (ref 43.0–77.0)
Platelets: 219 10*3/uL (ref 150.0–400.0)
RBC: 4.81 Mil/uL (ref 3.87–5.11)
RDW: 14.5 % (ref 11.5–15.5)
WBC: 7.5 10*3/uL (ref 4.0–10.5)

## 2018-11-14 LAB — TSH: TSH: 2.04 u[IU]/mL (ref 0.35–4.50)

## 2018-11-14 LAB — VITAMIN D 25 HYDROXY (VIT D DEFICIENCY, FRACTURES): VITD: 69.3 ng/mL (ref 30.00–100.00)

## 2018-11-17 ENCOUNTER — Encounter: Payer: Self-pay | Admitting: Family Medicine

## 2018-11-24 DIAGNOSIS — N870 Mild cervical dysplasia: Secondary | ICD-10-CM | POA: Diagnosis not present

## 2018-11-24 DIAGNOSIS — N879 Dysplasia of cervix uteri, unspecified: Secondary | ICD-10-CM | POA: Diagnosis not present

## 2018-12-04 ENCOUNTER — Other Ambulatory Visit: Payer: Self-pay

## 2018-12-04 ENCOUNTER — Telehealth: Payer: Self-pay | Admitting: Family Medicine

## 2018-12-04 DIAGNOSIS — Z1211 Encounter for screening for malignant neoplasm of colon: Secondary | ICD-10-CM

## 2018-12-04 NOTE — Telephone Encounter (Signed)
Order placed

## 2018-12-04 NOTE — Telephone Encounter (Signed)
See note  Copied from [place] [phone]. Topic: Referral - Request for Referral >> Dec 04, 2018 12:02 PM Sheran Luz wrote: Has patient seen PCP for this complaint? Yes, Patient states it was discussed at [place] on 2/10  Referral for which specialty: Gastroenterology  Preferred provider/office: No preferred  Reason for referral: colonoscopy

## 2019-01-31 ENCOUNTER — Encounter: Payer: Medicare Other | Admitting: Family Medicine

## 2019-03-08 ENCOUNTER — Telehealth: Payer: Self-pay | Admitting: Gastroenterology

## 2019-03-08 NOTE — Telephone Encounter (Signed)
Dr. Fuller Plan, there is a referral for pt to have a colonoscopy.  Pt's previous colonoscopy was with Dr. Earlean Shawl 07/20/2012--records in Epic for review.  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  Please review records and advise.

## 2019-03-08 NOTE — Telephone Encounter (Signed)
Reviewed Dr. Liliane Channel colonoscopy report from 07/2012. Based on that report she is due for surveillance colonoscopy due to a history of adenomatous colon polyps. OK for direct schedule colonoscopy with me in [place].

## 2019-03-22 ENCOUNTER — Ambulatory Visit: Payer: Medicare Other | Admitting: *Deleted

## 2019-03-22 ENCOUNTER — Other Ambulatory Visit: Payer: Self-pay

## 2019-03-22 VITALS — Ht 62.0 in | Wt 135.0 lb

## 2019-03-22 DIAGNOSIS — Z86010 Personal history of colonic polyps: Secondary | ICD-10-CM

## 2019-03-22 MED ORDER — SUPREP BOWEL PREP KIT 17.5-3.13-1.6 GM/177ML PO SOLN: 1.0000 | Freq: Once | ORAL | 0 refills | Status: AC

## 2019-03-22 NOTE — Progress Notes (Signed)
No egg or soy allergy known to patient  issues with past sedation with any surgeries  or procedures -hx of PONV , no intubation problems  No diet pills per patient No home 02 use per patient  No blood thinners per patient  Pt denies issues with constipation  No A fib or A flutter  EMMI video sent to pt's e mail   Pt verified name, DOB, address and insurance during PV today. Pt mailed instruction packet to included paper to complete and mail back to [facility] with addressed and stamped envelope, Emmi video, copy of consent form to read and not return, and instructions. PV completed over the phone. Pt encouraged to call with questions or issues    Pt is aware that care partner will wait in the car during parking lot; if they feel like they will be too hot to wait in the car; they may wait in the lobby.  We want them to wear a mask (we do not have any that we can provide them), practice social distancing, and we will check their temperatures when they get here.  I did remind patient that their care partner needs to stay in the parking lot the entire time. Pt will wear mask into building

## 2019-03-26 ENCOUNTER — Encounter: Payer: Self-pay | Admitting: Gastroenterology

## 2019-04-04 ENCOUNTER — Telehealth: Payer: Self-pay | Admitting: Gastroenterology

## 2019-04-04 NOTE — Telephone Encounter (Signed)
Spoke with patient regarding Covid-19 screening questions. °Covid-19 Screening Questions: ° °Do you now or have you had a fever in the last 14 days?  ° °Do you have any respiratory symptoms of shortness of breath or cough now or in the last 14 days?  ° °Do you have any family members or close contacts with diagnosed or suspected Covid-19 in the past 14 days?  ° °Have you been tested for Covid-19 and found to be positive?  ° °Pt made aware of that care partner may wait in the car or come up to the lobby during the procedure but will need to provide their own mask. °

## 2019-04-05 ENCOUNTER — Encounter: Payer: Self-pay | Admitting: Gastroenterology

## 2019-04-05 ENCOUNTER — Ambulatory Visit (AMBULATORY_SURGERY_CENTER): Payer: Medicare Other | Admitting: Gastroenterology

## 2019-04-05 ENCOUNTER — Other Ambulatory Visit: Payer: Self-pay

## 2019-04-05 VITALS — BP 122/70 | HR 66 | Temp 98.3°F | Resp 12 | Ht 62.0 in | Wt 135.0 lb

## 2019-04-05 DIAGNOSIS — D122 Benign neoplasm of ascending colon: Secondary | ICD-10-CM | POA: Diagnosis not present

## 2019-04-05 DIAGNOSIS — Z86010 Personal history of colonic polyps: Secondary | ICD-10-CM

## 2019-04-05 DIAGNOSIS — Z1211 Encounter for screening for malignant neoplasm of colon: Secondary | ICD-10-CM | POA: Diagnosis not present

## 2019-04-05 MED ORDER — SODIUM CHLORIDE 0.9 % IV SOLN: 500.0000 mL | Freq: Once | INTRAVENOUS | Status: DC

## 2019-04-05 NOTE — Op Note (Signed)
[facility] Patient Name: Alyssa Love Procedure Date: 04/05/2019 2:37 PM MRN: [id number] Endoscopist: Ladene Artist , MD Age: 68 Referring MD:  Date of Birth: 26-Dec-1950 Gender: Female Account #: [id number] Procedure:                Colonoscopy Indications:              High risk colon cancer surveillance: Personal                            history of traditional serrated adenoma of the colon Medicines:                Monitored Anesthesia Care Procedure:                Pre-Anesthesia Assessment:                           - Prior to the procedure, a History and Physical                            was performed, and patient medications and                            allergies were reviewed. The patient's tolerance of                            previous anesthesia was also reviewed. The risks                            and benefits of the procedure and the sedation                            options and risks were discussed with the patient.                            All questions were answered, and informed consent                            was obtained. Prior Anticoagulants: The patient has                            taken no previous anticoagulant or antiplatelet                            agents. ASA Grade Assessment: II - A patient with                            mild systemic disease. After reviewing the risks                            and benefits, the patient was deemed in                            satisfactory condition to undergo the procedure.  After obtaining informed consent, the colonoscope                            was passed under direct vision. Throughout the                            procedure, the patient's blood pressure, pulse, and                            oxygen saturations were monitored continuously. The                            Model PCF-[id number]) scope was introduced                            through  the anus and advanced to the the cecum,                            identified by appendiceal orifice and ileocecal                            valve. The ileocecal valve, appendiceal orifice,                            and rectum were photographed. The quality of the                            bowel preparation was excellent. The colonoscopy                            was performed without difficulty. The patient                            tolerated the procedure well. Scope In: 2:44:36 PM Scope Out: 3:04:36 PM Scope Withdrawal Time: 0 hours 14 minutes 3 seconds  Total Procedure Duration: 0 hours 20 minutes 0 seconds  Findings:                 The perianal and digital rectal examinations were                            normal.                           A 7 mm polyp was found in the ascending colon. The                            polyp was sessile. The polyp was removed with a                            cold snare. Resection and retrieval were complete.                           There was a small lipoma, 12 mm in diameter, in the  ascending colon.                           Multiple medium-mouthed diverticula were found in                            the left colon. There was no evidence of                            diverticular bleeding.                           Internal hemorrhoids were found during                            retroflexion. The hemorrhoids were small and Grade                            I (internal hemorrhoids that do not prolapse).                           The exam was otherwise without abnormality on                            direct and retroflexion views. Complications:            No immediate complications. Estimated blood loss:                            None. Estimated Blood Loss:     Estimated blood loss: none. Impression:               - One 7 mm polyp in the ascending colon, removed                            with a cold snare.  Resected and retrieved.                           - Ascending colon lipoma.                           - Mild diverticulosis in the left colon.                           - Internal hemorrhoids.                           - The examination was otherwise normal on direct                            and retroflexion views. Recommendation:           - Repeat colonoscopy in 5 years for surveillance.                           - Patient has a contact number available for  emergencies. The signs and symptoms of potential                            delayed complications were discussed with the                            patient. Return to normal activities tomorrow.                            Written discharge instructions were provided to the                            patient.                           - Resume previous diet.                           - Continue present medications.                           - Await pathology results. Ladene Artist, MD 04/05/2019 3:08:37 PM This report has been signed electronically.

## 2019-04-05 NOTE — Progress Notes (Signed)
A and O x3. Report to RN. Tolerated MAC anesthesia well.

## 2019-04-05 NOTE — Patient Instructions (Signed)
YOU HAD AN ENDOSCOPIC PROCEDURE TODAY AT [facility]:   Refer to the procedure report that was given to you for any specific questions about what was found during the examination.  If the procedure report does not answer your questions, please call your gastroenterologist to clarify.  If you requested that your care partner not be given the details of your procedure findings, then the procedure report has been included in a sealed envelope for you to review at your convenience later.  YOU SHOULD EXPECT: Some feelings of bloating in the abdomen. Passage of more gas than usual.  Walking can help get rid of the air that was put into your GI tract during the procedure and reduce the bloating. If you had a lower endoscopy (such as a colonoscopy or flexible sigmoidoscopy) you may notice spotting of blood in your stool or on the toilet paper. If you underwent a bowel prep for your procedure, you may not have a normal bowel movement for a few days.  Please Note:  You might notice some irritation and congestion in your nose or some drainage.  This is from the oxygen used during your procedure.  There is no need for concern and it should clear up in a day or so.  SYMPTOMS TO REPORT IMMEDIATELY:   Following lower endoscopy (colonoscopy or flexible sigmoidoscopy):  Excessive amounts of blood in the stool  Significant tenderness or worsening of abdominal pains  Swelling of the abdomen that is new, acute  Fever of 100F or higher    Black, tarry-looking stools  For urgent or emergent issues, a gastroenterologist can be reached at any hour by calling [phone].   DIET:  We do recommend a small meal at first, but then you may proceed to your regular diet.  Drink plenty of fluids but you should avoid alcoholic beverages for 24 hours.  ACTIVITY:  You should plan to take it easy for the rest of today and you should NOT DRIVE or use heavy machinery until tomorrow (because of the sedation  medicines used during the test).    FOLLOW UP: Our staff will call the number listed on your records 48-72 hours following your procedure to check on you and address any questions or concerns that you may have regarding the information given to you following your procedure. If we do not reach you, we will leave a message.  We will attempt to reach you two times.  During this call, we will ask if you have developed any symptoms of COVID 19. If you develop any symptoms (ie: fever, flu-like symptoms, shortness of breath, cough etc.) before then, please call [phone].  If you test positive for Covid 19 in the 2 weeks post procedure, please call and report this information to [place].    If any biopsies were taken you will be contacted by phone or by letter within the next 1-3 weeks.  Please call us at [phone] if you have not heard about the biopsies in 3 weeks.    SIGNATURES/CONFIDENTIALITY: You and/or your care partner have signed paperwork which will be entered into your electronic medical record.  These signatures attest to the fact that that the information above on your After Visit Summary has been reviewed and is understood.  Full responsibility of the confidentiality of this discharge information lies with you and/or your care-partner.

## 2019-04-05 NOTE — Progress Notes (Signed)
Called to room to assist during endoscopic procedure.  Patient ID and intended procedure confirmed with present staff. Received instructions for my participation in the procedure from the performing physician.  

## 2019-04-05 NOTE — Progress Notes (Signed)
Pt's states no medical or surgical changes since previsit or office visit.  Temp-Alyssa Love  Vital signs-judy bronson

## 2019-04-09 ENCOUNTER — Telehealth: Payer: Self-pay | Admitting: *Deleted

## 2019-04-09 NOTE — Telephone Encounter (Signed)
  Follow up Call-  Call back number 04/05/2019  Post procedure Call Back phone  # [phone]  Permission to leave phone message Yes  Some recent data might be hidden     Patient questions:  Do you have a fever, pain , or abdominal swelling? No. Pain Score  0   Have you tolerated food without any problems? Yes.    Have you been able to return to your normal activities? Yes.    Do you have any questions about your discharge instructions: Diet   No. Medications  No. Follow up visit  No.  Do you have questions or concerns about your Care? No.  Actions: * If pain score is 4 or above: No action needed, pain <4.  1. Have you developed a fever since your procedure? no  2.   Have you had an respiratory symptoms (SOB or cough) since your procedure? no  3.   Have you tested positive for COVID 19 since your procedure no  4.   Have you had any family members/close contacts diagnosed with the COVID 19 since your procedure?  no   If yes to any of these questions please route to Joylene John, RN and Alphonsa Gin, Therapist, sports.

## 2019-04-18 ENCOUNTER — Encounter: Payer: Self-pay | Admitting: Gastroenterology

## 2019-04-23 ENCOUNTER — Telehealth: Payer: Self-pay | Admitting: Gastroenterology

## 2019-04-23 NOTE — Telephone Encounter (Signed)
I reviewed the pathology results and letter with the patient . All questions answered.  She will call back for any additional questions or concerns.

## 2019-05-06 NOTE — Progress Notes (Deleted)
Subjective:   The patient is here for annual Medicare wellness examination and management of other chronic and acute problems.  . The risk factors are reflected in the social history. . The roster of all physicians providing medical care to patient - is listed in the Snapshot section of the chart. . Activities of daily living:  The patient is 100% independent in all ADLs: dressing, toileting, feeding as well as independent mobility. . Home safety  The patient has smoke detectors in the home. They wear seatbelts.  There are no firearms at home. There is no violence in the home.  . There is no risks for hepatitis, STDs, or HIV. There is no history of blood transfusion. They have no travel history to infectious disease endemic areas of the world. . The patient has seen a dentist in the last six months. They have seen their eye doctor in the last year. They admit to slight hearing difficulty with regard to whispered voices and some television programs.  They have deferred audiologic testing in the last year.  They do not  have excessive sun exposure. Discussed the need for sun protection: hats, long sleeves and use of sunscreen if there is significant sun exposure.  . The importance of a healthy diet is discussed. They do have a healthy diet. . The benefits of regular aerobic exercise were discussed.  . There are no signs or vegative symptoms of depression, including irritability, change in appetite, anhedonia, sadness/tearfullness. . Cognitive assessment completed and without concerns. . The following portions of the patient's history were reviewed and updated as appropriate: allergies, current medications, past family history, past medical history, past surgical history, past social history  and problem list.  Patient Care Team: Briscoe Deutscher, DO as PCP - General (Family Medicine)  Past medical, surgical, social and family history reviewed and updated:  Patient Active Problem List    Diagnosis Date Noted  . COLONIC POLYPS 05/13/2010  . HYPERLIPIDEMIA 05/13/2010  . COPD (chronic obstructive pulmonary disease) ([place]) 05/13/2010  . SCOLIOSIS 05/13/2010  . FOOT PAIN, LEFT 04/23/2010   Past Surgical History:  Procedure Laterality Date  . CATARACT EXTRACTION, BILATERAL     age 68 and 68   . CHOLECYSTECTOMY  11[birth year]  . COLONOSCOPY    . CYST EXCISION Right 08/25/2018   Procedure: EXCISE CONJUNCTIVAL CYST;  Surgeon: Everitt Amber, MD;  Location: [facility];  Service: Ophthalmology;  Laterality: Right;  . dental implant    . POLYPECTOMY    . STRABISMUS SURGERY Left 08/25/2018   Procedure: REPAIR STRABISMUS LEFT EYE;  Surgeon: Everitt Amber, MD;  Location: [facility];  Service: Ophthalmology;  Laterality: Left;- revision 2020  . WISDOM TOOTH EXTRACTION     Social History   Tobacco Use  . Smoking status: Current Every Day Smoker    Packs/day: 0.50    Types: Cigarettes    Start date: 03/24/1968  . Smokeless tobacco: Never Used  Substance Use Topics  . Alcohol use: Yes    Alcohol/week: 4.0 standard drinks    Types: 4 Glasses of wine per week   Family History  Problem Relation Age of Onset  . Stroke Father 7  . Prostate cancer Father   . Hypertension Father   . Lung cancer Mother   . Colon polyps Mother   . Cancer Maternal Grandmother   . Colon cancer Maternal Grandmother   . Cancer Paternal Grandmother   . Heart attack Paternal Grandfather   . Esophageal cancer  Neg Hx   . Rectal cancer Neg Hx   . Stomach cancer Neg Hx     Current medication list and allergy/intolerance information reviewed and updated:    Current Outpatient Medications:  .  Ascorbic Acid (VITAMIN C) 100 MG tablet, Vitamin C, Disp: , Rfl:  .  aspirin EC 81 MG tablet, Take 81 mg by mouth daily., Disp: , Rfl:  .  calcium carbonate (OS-CAL) 600 MG TABS, Take 600 mg by mouth daily., Disp: , Rfl:  .  cholecalciferol (VITAMIN D) 1000 UNITS tablet, Take 1,000 Units  by mouth daily., Disp: , Rfl:  .  ELDERBERRY PO, Take by mouth. With 7 added zinc daily, Disp: , Rfl:  .  Magnesium 500 MG CAPS, Take by mouth., Disp: , Rfl:  .  Omega-3 Fatty Acids (FISH OIL) 1000 MG CAPS, 1,200 mg. , Disp: , Rfl:  .  OVER THE COUNTER MEDICATION, daily. Tumeric and Curcumin, Disp: , Rfl:  .  OVER THE COUNTER MEDICATION, Multi Collagan tablet, Disp: , Rfl:  .  PREBIOTIC PRODUCT PO, Take by mouth daily., Disp: , Rfl:  .  Probiotic Product (PROBIOTIC-10 PO), Take by mouth., Disp: , Rfl:  .  vitamin C (ASCORBIC ACID) 500 MG tablet, Take 1,000 mg by mouth daily. , Disp: , Rfl:  .  Vitamin E 100 UNIT/GM CREA, vitamin E, Disp: , Rfl:  .  Zinc 30 MG CAPS, Take by mouth., Disp: , Rfl:   Allergies  Allergen Reactions  . Codeine Other (See Comments)    Horrible dreams and hallucinations- She has had codeine cough meds with NO issues   . Hydrocodone-Acetaminophen     REACTION: hallucinations    ROS  Health Risk Assessment:   CLINICAL INTAKE, INCLUDING ADLS, SENSORY DIFFICULTY                      GOALS Goals   None     FUNCTIONAL STATUS SURVEY, EXERCISE, CARDIAC RISK FACTORS    DEPRESSION QUESTIONNAIRE Depression screen [facility] 2/9 11/13/2018  Decreased Interest 0  Down, Depressed, Hopeless 0  PHQ - 2 Score 0  Altered sleeping 0  Tired, decreased energy 0  Change in appetite 0  Feeling bad or failure about yourself  0  Trouble concentrating 0  Moving slowly or fidgety/restless 0  Suicidal thoughts 0  PHQ-9 Score 0  Difficult doing work/chores Not difficult at all    FALL RISK Fall Risk  11/13/2018 11/13/2018  Falls in the past year? 0 0  Number falls in past yr: 0 0  Injury with Fall? 0 0    COGNITIVE FUNCTION   Objective:   There were no vitals filed for this visit. There is no height or weight on file to calculate BMI.  Physical Exam  ***Timed Get Up and Go performed: yes, taking < 12 seconds Note: If takes > 12 seconds to complete,  patient at increased risk of falling. Note if patient has slow pace, loss of balance, short strides, little or no arm swings, steadying self on walls, shuffling, not using assist device properly.   Assessment and Plan:   There are no diagnoses linked to this encounter.  Immunization History  Administered Date(s) Administered  . Influenza Split 07/18/2013  . Influenza Whole 06/20/2012  . Influenza,inj,Quad PF,6+ Mos 06/29/2018  . Pneumococcal Polysaccharide-23 08/01/2003, 05/15/2010  . Td 08/01/2003  . Tdap 06/27/2012    Qualifies for Shingles Vaccine? Yes Note: Contraindications include severe allergic reaction (e.g., anaphylaxis) after  a previous dose or to a vaccine component, known severe immunodeficiency (e.g., from hematologic and solid tumors, receipt of chemotherapy, congenital immunodeficiency,  long-term immunosuppressive therapy(g) or patients with HIV infection who are severely immunocompromised), and pregnancy.  Screening Tests Health Maintenance  Topic Date Due  . Hepatitis C Screening  [birth year]  . MAMMOGRAM  09/21/2014  . DEXA SCAN  03/25/2015  . PNA vac Low Risk Adult (1 of 2 - PCV13) 03/25/2015  . INFLUENZA VACCINE  05/05/2019  . TETANUS/TDAP  06/27/2022  . COLONOSCOPY  04/04/2024    Cancer Screenings: Lung: Low Dose CT Chest recommended if Age 79-80 years, 30 pack-year currently smoking OR have quit w/in 15 years. Patient {DOES NOT does:27190::"does not"} qualify. Breast:  Up to date on Mammogram? {Yes/No:30480221}   Up to date of Bone Density/Dexa? {Yes/No:30480221} Colorectal: ***  Advanced Directives {advanced directive:12455}  I have personally reviewed and noted the following in the patient's chart:   . Medical and social history . Use of alcohol, tobacco or illicit drugs  . Current medications and supplements . Functional ability and status . Nutritional status . Physical activity . Advanced directives . List of other  physicians . Hospitalizations, surgeries, and ER visits in previous 12 months . Vitals . Screenings to include cognitive, depression, and falls . Referrals and appointments  In addition, I have reviewed and discussed with patient certain preventive protocols, quality metrics, and best practice recommendations. A personalized care plan for preventive services as well as general preventive health recommendations were provided to patient.   Briscoe Deutscher, DO

## 2019-05-07 ENCOUNTER — Encounter: Payer: Medicare Other | Admitting: Family Medicine

## 2019-05-18 ENCOUNTER — Ambulatory Visit: Payer: Medicare Other | Admitting: Family Medicine

## 2019-10-29 DIAGNOSIS — N952 Postmenopausal atrophic vaginitis: Secondary | ICD-10-CM | POA: Diagnosis not present

## 2019-10-29 DIAGNOSIS — N951 Menopausal and female climacteric states: Secondary | ICD-10-CM | POA: Diagnosis not present

## 2019-10-30 DIAGNOSIS — R87610 Atypical squamous cells of undetermined significance on cytologic smear of cervix (ASC-US): Secondary | ICD-10-CM | POA: Diagnosis not present

## 2019-11-02 DIAGNOSIS — Z1231 Encounter for screening mammogram for malignant neoplasm of breast: Secondary | ICD-10-CM | POA: Diagnosis not present

## 2019-11-02 DIAGNOSIS — Z780 Asymptomatic menopausal state: Secondary | ICD-10-CM | POA: Diagnosis not present

## 2019-11-02 LAB — HM MAMMOGRAPHY

## 2019-11-16 ENCOUNTER — Encounter: Payer: Medicare Other | Admitting: Family Medicine

## 2019-12-03 ENCOUNTER — Encounter: Payer: Self-pay | Admitting: Family Medicine

## 2019-12-05 ENCOUNTER — Ambulatory Visit (INDEPENDENT_AMBULATORY_CARE_PROVIDER_SITE_OTHER): Payer: Medicare Other | Admitting: Family Medicine

## 2019-12-05 ENCOUNTER — Encounter: Payer: Self-pay | Admitting: Family Medicine

## 2019-12-05 ENCOUNTER — Other Ambulatory Visit: Payer: Self-pay

## 2019-12-05 VITALS — BP 160/80 | HR 79 | Temp 97.3°F | Ht 62.0 in | Wt 138.4 lb

## 2019-12-05 DIAGNOSIS — H509 Unspecified strabismus: Secondary | ICD-10-CM | POA: Insufficient documentation

## 2019-12-05 DIAGNOSIS — R030 Elevated blood-pressure reading, without diagnosis of hypertension: Secondary | ICD-10-CM

## 2019-12-05 DIAGNOSIS — E782 Mixed hyperlipidemia: Secondary | ICD-10-CM

## 2019-12-05 DIAGNOSIS — Z1159 Encounter for screening for other viral diseases: Secondary | ICD-10-CM

## 2019-12-05 DIAGNOSIS — Z0000 Encounter for general adult medical examination without abnormal findings: Secondary | ICD-10-CM | POA: Diagnosis not present

## 2019-12-05 LAB — CBC WITH DIFFERENTIAL/PLATELET
Basophils Absolute: 0.1 10*3/uL (ref 0.0–0.1)
Basophils Relative: 0.8 % (ref 0.0–3.0)
Eosinophils Absolute: 0.1 10*3/uL (ref 0.0–0.7)
Eosinophils Relative: 1.4 % (ref 0.0–5.0)
HCT: 44.3 % (ref 36.0–46.0)
Hemoglobin: 14.7 g/dL (ref 12.0–15.0)
Lymphocytes Relative: 27 % (ref 12.0–46.0)
Lymphs Abs: 2.1 10*3/uL (ref 0.7–4.0)
MCHC: 33.1 g/dL (ref 30.0–36.0)
MCV: 91.8 fl (ref 78.0–100.0)
Monocytes Absolute: 0.7 10*3/uL (ref 0.1–1.0)
Monocytes Relative: 9.7 % (ref 3.0–12.0)
Neutro Abs: 4.7 10*3/uL (ref 1.4–7.7)
Neutrophils Relative %: 61.1 % (ref 43.0–77.0)
Platelets: 226 10*3/uL (ref 150.0–400.0)
RBC: 4.82 Mil/uL (ref 3.87–5.11)
RDW: 14.6 % (ref 11.5–15.5)
WBC: 7.6 10*3/uL (ref 4.0–10.5)

## 2019-12-05 LAB — LIPID PANEL
Cholesterol: 235 mg/dL — ABNORMAL HIGH (ref 0–200)
HDL: 64.2 mg/dL (ref >39.00)
LDL Cholesterol: 149 mg/dL — ABNORMAL HIGH (ref 0–99)
NonHDL: 170.42
Total CHOL/HDL Ratio: 4
Triglycerides: 106 mg/dL (ref 0.0–149.0)
VLDL: 21.2 mg/dL (ref 0.0–40.0)

## 2019-12-05 LAB — COMPREHENSIVE METABOLIC PANEL
ALT: 23 U/L (ref 0–35)
AST: 23 U/L (ref 0–37)
Albumin: 4.2 g/dL (ref 3.5–5.2)
Alkaline Phosphatase: 89 U/L (ref 39–117)
BUN: 8 mg/dL (ref 6–23)
CO2: 29 mEq/L (ref 19–32)
Calcium: 10 mg/dL (ref 8.4–10.5)
Chloride: 104 mEq/L (ref 96–112)
Creatinine, Ser: 0.68 mg/dL (ref 0.40–1.20)
GFR: 85.62 mL/min (ref >60.00)
Glucose, Bld: 89 mg/dL (ref 70–99)
Potassium: 4.5 mEq/L (ref 3.5–5.1)
Sodium: 140 mEq/L (ref 135–145)
Total Bilirubin: 0.5 mg/dL (ref 0.2–1.2)
Total Protein: 7.3 g/dL (ref 6.0–8.3)

## 2019-12-05 LAB — TSH: TSH: 2.08 u[IU]/mL (ref 0.35–4.50)

## 2019-12-05 LAB — VITAMIN D 25 HYDROXY (VIT D DEFICIENCY, FRACTURES): VITD: 77.51 ng/mL (ref 30.00–100.00)

## 2019-12-05 NOTE — Patient Instructions (Signed)
Preventive Care 53 Years and Older, Female Preventive care refers to lifestyle choices and visits with your health care provider that can promote health and wellness. This includes:  A yearly physical exam. This is also called an annual well check.  Regular dental and eye exams.  Immunizations.  Screening for certain conditions.  Healthy lifestyle choices, such as diet and exercise. What can I expect for my preventive care visit? Physical exam Your health care provider will check:  Height and weight. These may be used to calculate body mass index (BMI), which is a measurement that tells if you are at a healthy weight.  Heart rate and blood pressure.  Your skin for abnormal spots. Counseling Your health care provider may ask you questions about:  Alcohol, tobacco, and drug use.  Emotional well-being.  Home and relationship well-being.  Sexual activity.  Eating habits.  History of falls.  Memory and ability to understand (cognition).  Work and work Statistician.  Pregnancy and menstrual history. What immunizations do I need?  Influenza (flu) vaccine  This is recommended every year. Tetanus, diphtheria, and pertussis (Tdap) vaccine  You may need a Td booster every 10 years. Varicella (chickenpox) vaccine  You may need this vaccine if you have not already been vaccinated. Zoster (shingles) vaccine  You may need this after age 24. Pneumococcal conjugate (PCV13) vaccine  One dose is recommended after age 74. Pneumococcal polysaccharide (PPSV23) vaccine  One dose is recommended after age 44. Measles, mumps, and rubella (MMR) vaccine  You may need at least one dose of MMR if you were born in 1957 or later. You may also need a second dose. Meningococcal conjugate (MenACWY) vaccine  You may need this if you have certain conditions. Hepatitis A vaccine  You may need this if you have certain conditions or if you travel or work in places where you may be exposed  to hepatitis A. Hepatitis B vaccine  You may need this if you have certain conditions or if you travel or work in places where you may be exposed to hepatitis B. Haemophilus influenzae type b (Hib) vaccine  You may need this if you have certain conditions. You may receive vaccines as individual doses or as more than one vaccine together in one shot (combination vaccines). Talk with your health care provider about the risks and benefits of combination vaccines. What tests do I need? Blood tests  Lipid and cholesterol levels. These may be checked every 5 years, or more frequently depending on your overall health.  Hepatitis C test.  Hepatitis B test. Screening  Lung cancer screening. You may have this screening every year starting at age 69 if you have a 30-pack-year history of smoking and currently smoke or have quit within the past 15 years.  Colorectal cancer screening. All adults should have this screening starting at age 39 and continuing until age 19. Your health care provider may recommend screening at age 77 if you are at increased risk. You will have tests every 1-10 years, depending on your results and the type of screening test.  Diabetes screening. This is done by checking your blood sugar (glucose) after you have not eaten for a while (fasting). You may have this done every 1-3 years.  Mammogram. This may be done every 1-2 years. Talk with your health care provider about how often you should have regular mammograms.  BRCA-related cancer screening. This may be done if you have a family history of breast, ovarian, tubal, or peritoneal cancers.  Other tests  Sexually transmitted disease (STD) testing.  Bone density scan. This is done to screen for osteoporosis. You may have this done starting at age 75. Follow these instructions at home: Eating and drinking  Eat a diet that includes fresh fruits and vegetables, whole grains, lean protein, and low-fat dairy products. Limit  your intake of foods with high amounts of sugar, saturated fats, and salt.  Take vitamin and mineral supplements as recommended by your health care provider.  Do not drink alcohol if your health care provider tells you not to drink.  If you drink alcohol: ? Limit how much you have to 0-1 drink a day. ? Be aware of how much alcohol is in your drink. In the [place] one drink equals one 12 oz bottle of beer (355 mL), one 5 oz glass of wine (148 mL), or one 1 oz glass of hard liquor (44 mL). Lifestyle  Take daily care of your teeth and gums.  Stay active. Exercise for at least 30 minutes on 5 or more days each week.  Do not use any products that contain nicotine or tobacco, such as cigarettes, e-cigarettes, and chewing tobacco. If you need help quitting, ask your health care provider.  If you are sexually active, practice safe sex. Use a condom or other form of protection in order to prevent STIs (sexually transmitted infections).  Talk with your health care provider about taking a low-dose aspirin or statin. What's next?  Go to your health care provider once a year for a well check visit.  Ask your health care provider how often you should have your eyes and teeth checked.  Stay up to date on all vaccines. This information is not intended to replace advice given to you by your health care provider. Make sure you discuss any questions you have with your health care provider. Document Revised: 09/14/2018 Document Reviewed: 09/14/2018 Elsevier Patient Education  2020 [employer].

## 2019-12-05 NOTE — Progress Notes (Signed)
Patient: Alyssa Love MRN: [id number] DOB: [birth year] PCP: Orma Flaming, MD     Subjective:  Chief Complaint  Patient presents with  . Transitions Of Care    HPI: The patient is a 69 y.o. female who presents today for annual exam. She denies any changes to past medical history. There have been no recent hospitalizations. They are following a well balanced diet and exercise plan. Weight has been stable. No complaints today. Gets yearly skin exam at [facility].   She is smoking 1/2 PPD. Not ready to stop.   Past medical history significant for tobacco abuse, hyperlipidemia, scoliosis and strabismus with revision on her left eye in 2020.   FH of breast cancer in her mother diagnosed in late 42's. No first degree relative with colon cancer.   Immunization History  Administered Date(s) Administered  . Influenza Split 07/18/2013  . Influenza Whole 06/20/2012, 07/04/2019  . Influenza,inj,Quad PF,6+ Mos 06/29/2018  . Pneumococcal Polysaccharide-23 08/01/2003, 05/15/2010  . Td 08/01/2003  . Tdap 06/27/2012   Colonoscopy: 04/2019. Repeat 5 years.  Mammogram: 1/20201 Pap smear: +HPV hx. 09/2019. Hx of colposcopy. No LEEP procedure.   Review of Systems  Constitutional: Negative for chills, fatigue and fever.  HENT: Negative for dental problem, ear pain, hearing loss, sinus pressure, sneezing, sore throat and trouble swallowing.   Eyes: Negative for visual disturbance.  Respiratory: Negative for cough, chest tightness, shortness of breath and wheezing.   Cardiovascular: Negative for chest pain, palpitations and leg swelling.  Gastrointestinal: Negative for abdominal pain, blood in stool, diarrhea and nausea.  Endocrine: Negative for cold intolerance, polydipsia, polyphagia and polyuria.  Genitourinary: Negative for dysuria, frequency, hematuria, pelvic pain and urgency.  Musculoskeletal: Negative for arthralgias.  Skin: Negative for rash.  Neurological: Negative for  dizziness, weakness and headaches.  Psychiatric/Behavioral: Negative for dysphoric mood and sleep disturbance. The patient is not nervous/anxious.     Allergies Patient is allergic to codeine and hydrocodone-acetaminophen.  Past Medical History Patient  has a past medical history of Allergy, Blood transfusion without reported diagnosis, Cataract, Chicken pox, Colon polyp, Complication of anesthesia, Hyperlipidemia, Left foot pain, PONV (postoperative nausea and vomiting), Scoliosis, and Smoker.  Surgical History Patient  has a past surgical history that includes Cholecystectomy (11-27-53); Strabismus surgery (Left, 08/25/2018); Cyst excision (Right, 08/25/2018); Colonoscopy; Polypectomy; Wisdom tooth extraction; dental implant; and Cataract extraction, bilateral.  Family History Pateint's family history includes Cancer in her maternal grandmother and paternal grandmother; Colon cancer in her maternal grandmother; Colon polyps in her mother; Heart attack in her paternal grandfather; Hypertension in her father; Lung cancer in her mother; Prostate cancer in her father; Stroke (age of onset: 3) in her father.  Social History Patient  reports that she has been smoking cigarettes. She started smoking about 51 years ago. She has been smoking about 0.50 packs per day. She has never used smokeless tobacco. She reports current alcohol use of about 4.0 standard drinks of alcohol per week. She reports that she does not use drugs.    Objective: Vitals:   12/05/19 1052 12/05/19 1142  BP: (!) 147/85 (!) 160/80  Pulse: 79   Temp: (!) 97.3 F (36.3 C)   TempSrc: Temporal   SpO2: 97%   Weight: 138 lb 6.4 oz (62.8 kg)   Height: 5\' 2"  (1.575 m)     Body mass index is 25.31 kg/m.  Physical Exam Vitals reviewed.  Constitutional:      Appearance: Normal appearance. She is well-developed and normal weight.  HENT:     Head: Normocephalic and atraumatic.     Right Ear: Tympanic membrane, ear canal and  external ear normal.     Left Ear: Tympanic membrane, ear canal and external ear normal.     Nose: Nose normal.     Mouth/Throat:     Mouth: Mucous membranes are moist.  Eyes:     Extraocular Movements: Extraocular movements intact.     Conjunctiva/sclera: Conjunctivae normal.     Pupils: Pupils are equal, round, and reactive to light.  Neck:     Thyroid: No thyromegaly.     Vascular: No carotid bruit.  Cardiovascular:     Rate and Rhythm: Normal rate and regular rhythm.     Heart sounds: Normal heart sounds. No murmur.  Pulmonary:     Effort: Pulmonary effort is normal.     Breath sounds: Normal breath sounds.  Abdominal:     General: Abdomen is flat. Bowel sounds are normal. There is no distension.     Palpations: Abdomen is soft.     Tenderness: There is no abdominal tenderness.  Musculoskeletal:     Cervical back: Normal range of motion and neck supple.  Lymphadenopathy:     Cervical: No cervical adenopathy.  Skin:    General: Skin is warm and dry.     Capillary Refill: Capillary refill takes less than 2 seconds.     Findings: No rash.  Neurological:     General: No focal deficit present.     Mental Status: She is alert and oriented to person, place, and time.     Cranial Nerves: No cranial nerve deficit.     Coordination: Coordination normal.     Deep Tendon Reflexes: Reflexes normal.  Psychiatric:        Mood and Affect: Mood normal.        Behavior: Behavior normal.      Office Visit from 12/05/2019 in [place]  PHQ-2 Total Score  0         Assessment/plan: 1. Annual physical exam Routine fasting labs today. HM reviewed and UTD. Smoking counseling done, but not ready to quit. She is going to work on diet, exercise, low salt diet and weight loss. Overall doing well. F/u in one year or as needed.  Patient counseling [x]    Nutrition: Stressed importance of moderation in sodium/caffeine intake, saturated fat and cholesterol, caloric  balance, sufficient intake of fresh fruits, vegetables, fiber, calcium, iron, and 1 mg of folate supplement per day (for females capable of pregnancy).  [x]    Stressed the importance of regular exercise.   []    Substance Abuse: Discussed cessation/primary prevention of tobacco, alcohol, or other drug use; driving or other dangerous activities under the influence; availability of treatment for abuse.   [x]    Injury prevention: Discussed safety belts, safety helmets, smoke detector, smoking near bedding or upholstery.   [x]    Sexuality: Discussed sexually transmitted diseases, partner selection, use of condoms, avoidance of unintended pregnancy  and contraceptive alternatives.  [x]    Dental health: Discussed importance of regular tooth brushing, flossing, and dental visits.  [x]    Health maintenance and immunizations reviewed. Please refer to Health maintenance section.    - CBC with Differential/Platelet - Comprehensive metabolic panel - TSH - VITAMIN D 25 Hydroxy (Vit-D Deficiency, Fractures)  2. Mixed hyperlipidemia  - Lipid panel  3. Encounter for hepatitis C screening test for low risk patient  - Hepatitis C antibody  4. Elevated blood  pressure reading Elevated x2 today. She states it has always been good. We will have her keep a log and if >140/90 consistently asked that she come back in or let me know. Past readings in office have been to goal.    Return in about 1 year (around 12/04/2020).     Orma Flaming, MD [place]  12/05/2019

## 2019-12-06 LAB — HEPATITIS C ANTIBODY
Hepatitis C Ab: NONREACTIVE
SIGNAL TO CUT-OFF: 0.01 (ref <1.00)

## 2019-12-13 ENCOUNTER — Emergency Department (HOSPITAL_COMMUNITY)
Admission: EM | Admit: 2019-12-13 | Discharge: 2019-12-13 | Disposition: A | Discharge status: Home / Self Care | Payer: Medicare Other | Attending: Emergency Medicine | Admitting: Emergency Medicine

## 2019-12-13 ENCOUNTER — Emergency Department (HOSPITAL_COMMUNITY): Payer: Medicare Other

## 2019-12-13 ENCOUNTER — Other Ambulatory Visit: Payer: Self-pay

## 2019-12-13 ENCOUNTER — Encounter (HOSPITAL_COMMUNITY): Payer: Self-pay

## 2019-12-13 DIAGNOSIS — R112 Nausea with vomiting, unspecified: Secondary | ICD-10-CM | POA: Insufficient documentation

## 2019-12-13 DIAGNOSIS — F17210 Nicotine dependence, cigarettes, uncomplicated: Secondary | ICD-10-CM | POA: Diagnosis not present

## 2019-12-13 DIAGNOSIS — R1031 Right lower quadrant pain: Secondary | ICD-10-CM | POA: Diagnosis present

## 2019-12-13 DIAGNOSIS — R1110 Vomiting, unspecified: Secondary | ICD-10-CM | POA: Diagnosis not present

## 2019-12-13 DIAGNOSIS — N83201 Unspecified ovarian cyst, right side: Secondary | ICD-10-CM | POA: Insufficient documentation

## 2019-12-13 LAB — COMPREHENSIVE METABOLIC PANEL
ALT: 25 U/L (ref 0–44)
AST: 29 U/L (ref 15–41)
Albumin: 3.9 g/dL (ref 3.5–5.0)
Alkaline Phosphatase: 86 U/L (ref 38–126)
Anion gap: 9 (ref 5–15)
BUN: 12 mg/dL (ref 8–23)
CO2: 25 mmol/L (ref 22–32)
Calcium: 9.4 mg/dL (ref 8.9–10.3)
Chloride: 103 mmol/L (ref 98–111)
Creatinine, Ser: 0.55 mg/dL (ref 0.44–1.00)
GFR calc Af Amer: >60 mL/min (ref >60)
GFR calc non Af Amer: >60 mL/min (ref >60)
Glucose, Bld: 93 mg/dL (ref 70–99)
Potassium: 4.1 mmol/L (ref 3.5–5.1)
Sodium: 137 mmol/L (ref 135–145)
Total Bilirubin: 0.4 mg/dL (ref 0.3–1.2)
Total Protein: 7.4 g/dL (ref 6.5–8.1)

## 2019-12-13 LAB — URINALYSIS, ROUTINE W REFLEX MICROSCOPIC
Bilirubin Urine: NEGATIVE
Glucose, UA: NEGATIVE mg/dL
Hgb urine dipstick: NEGATIVE
Ketones, ur: NEGATIVE mg/dL
Leukocytes,Ua: NEGATIVE
Nitrite: NEGATIVE
Protein, ur: NEGATIVE mg/dL
Specific Gravity, Urine: 1.002 — ABNORMAL LOW (ref 1.005–1.030)
pH: 7 (ref 5.0–8.0)

## 2019-12-13 LAB — CBC
HCT: 43.8 % (ref 36.0–46.0)
Hemoglobin: 14.5 g/dL (ref 12.0–15.0)
MCH: 30 pg (ref 26.0–34.0)
MCHC: 33.1 g/dL (ref 30.0–36.0)
MCV: 90.5 fL (ref 80.0–100.0)
Platelets: 244 10*3/uL (ref 150–400)
RBC: 4.84 MIL/uL (ref 3.87–5.11)
RDW: 14.3 % (ref 11.5–15.5)
WBC: 9.1 10*3/uL (ref 4.0–10.5)
nRBC: 0 % (ref 0.0–0.2)

## 2019-12-13 LAB — LIPASE, BLOOD: Lipase: 29 U/L (ref 11–51)

## 2019-12-13 MED ORDER — SODIUM CHLORIDE (PF) 0.9 % IJ SOLN
INTRAMUSCULAR | Status: AC
  Filled 2019-12-13: qty 50

## 2019-12-13 MED ORDER — SODIUM CHLORIDE 0.9% FLUSH
3.0000 mL | Freq: Once | INTRAVENOUS | Status: AC
  Administered 2019-12-13: 3 mL via INTRAVENOUS

## 2019-12-13 MED ORDER — IOHEXOL 300 MG/ML  SOLN
100.0000 mL | Freq: Once | INTRAMUSCULAR | Status: AC | PRN
  Administered 2019-12-13: 100 mL via INTRAVENOUS

## 2019-12-13 NOTE — ED Triage Notes (Signed)
Patient c/o RLQ pain that radiates into the right lower back since this AM. Patient denies any v/d.

## 2019-12-13 NOTE — Discharge Instructions (Addendum)
You were evaluated in the Emergency Department and after careful evaluation, we did not find any emergent condition requiring admission or further testing in the hospital.  Your exam/testing today is overall reassuring.  Your symptoms seem to be due to an ovarian cyst.  As discussed, we recommend follow-up with your GYN doctor for further evaluation and ultrasound.  Please return to the Emergency Department if you experience any worsening of your condition.  We encourage you to follow up with a primary care provider.  Thank you for allowing [place] to be a part of your care.

## 2019-12-13 NOTE — ED Provider Notes (Signed)
[facility] Emergency Department Provider Note MRN:  [id number]  Arrival date & time: 12/13/19     Chief Complaint   Abdominal Pain and Bloated   History of Present Illness   Alyssa Love is a 69 y.o. year-old female with a history of hyperlipidemia presenting to the ED with chief complaint of abdominal pain.  Location: Right lower quadrant Duration: 12 hours Onset: Gradual Timing: Intermittent Description: Dull ache Severity: Mild to moderate Exacerbating/Alleviating Factors: None Associated Symptoms: Nausea, 1-2 episodes of nonbloody nonbilious emesis this evening Pertinent Negatives: No fever, no chest pain, no shortness of breath, no diarrhea, no constipation   Review of Systems  A complete 10 system review of systems was obtained and all systems are negative except as noted in the HPI and PMH.   Patient's Health History    Past Medical History:  Diagnosis Date  . Allergy    very mild- pollen   . Blood transfusion without reported diagnosis    35 yrs ago   . Cataract    removed right  age 24, left 79   . Chicken pox   . Colon polyp   . Complication of anesthesia   . Hyperlipidemia   . Left foot pain   . PONV (postoperative nausea and vomiting)   . Scoliosis   . Smoker     Past Surgical History:  Procedure Laterality Date  . CATARACT EXTRACTION, BILATERAL     age 20 and 60   . CHOLECYSTECTOMY  11-27-53  . COLONOSCOPY    . CYST EXCISION Right 08/25/2018   Procedure: EXCISE CONJUNCTIVAL CYST;  Surgeon: Everitt Amber, MD;  Location: [facility];  Service: Ophthalmology;  Laterality: Right;  . dental implant    . POLYPECTOMY    . STRABISMUS SURGERY Left 08/25/2018   Procedure: REPAIR STRABISMUS LEFT EYE;  Surgeon: Everitt Amber, MD;  Location: [place];  Service: Ophthalmology;  Laterality: Left;- revision 2020  . WISDOM TOOTH EXTRACTION      Family History  Problem Relation Age of Onset  .  Stroke Father 33  . Prostate cancer Father   . Hypertension Father   . Lung cancer Mother   . Colon polyps Mother   . Cancer Maternal Grandmother   . Colon cancer Maternal Grandmother   . Cancer Paternal Grandmother   . Heart attack Paternal Grandfather   . Esophageal cancer Neg Hx   . Rectal cancer Neg Hx   . Stomach cancer Neg Hx     Social History   Socioeconomic History  . Marital status: Divorced    Spouse name: Not on file  . Number of children: Not on file  . Years of education: Not on file  . Highest education level: Not on file  Occupational History  . Not on file  Tobacco Use  . Smoking status: Current Every Day Smoker    Packs/day: 0.50    Types: Cigarettes    Start date: 03/24/1968  . Smokeless tobacco: Never Used  Substance and Sexual Activity  . Alcohol use: Yes    Alcohol/week: 4.0 standard drinks    Types: 4 Glasses of wine per week  . Drug use: No  . Sexual activity: Not on file  Other Topics Concern  . Not on file  Social History Narrative  . Not on file   Social Determinants of Health   Financial Resource Strain:   . Difficulty of Paying Living Expenses:   Food Insecurity:   .  Worried About Charity fundraiser in the Last Year:   . Arboriculturist in the Last Year:   Transportation Needs:   . Film/video editor (Medical):   Marland Kitchen Lack of Transportation (Non-Medical):   Physical Activity:   . Days of Exercise per Week:   . Minutes of Exercise per Session:   Stress:   . Feeling of Stress :   Social Connections:   . Frequency of Communication with Friends and Family:   . Frequency of Social Gatherings with Friends and Family:   . Attends Religious Services:   . Active Member of Clubs or Organizations:   . Attends Archivist Meetings:   Marland Kitchen Marital Status:   Intimate Partner Violence:   . Fear of Current or Ex-Partner:   . Emotionally Abused:   Marland Kitchen Physically Abused:   . Sexually Abused:      Physical Exam   Vitals:    12/13/19 2015 12/13/19 2030  BP: (!) 155/79 (!) 167/88  Pulse: 84 89  Resp: 16 18  Temp:    SpO2: 97% 99%    CONSTITUTIONAL: Well-appearing, NAD NEURO:  Alert and oriented x 3, no focal deficits EYES:  eyes equal and reactive ENT/NECK:  no LAD, no JVD CARDIO: Regular rate, well-perfused, normal S1 and S2 PULM:  CTAB no wheezing or rhonchi GI/GU:  normal bowel sounds, non-distended, non-tender MSK/SPINE:  No gross deformities, no edema SKIN:  no rash, atraumatic PSYCH:  Appropriate speech and behavior  *Additional and/or pertinent findings included in MDM below  Diagnostic and Interventional Summary    EKG Interpretation  Date/Time:    Ventricular Rate:    PR Interval:    QRS Duration:   QT Interval:    QTC Calculation:   R Axis:     Text Interpretation:        Labs Reviewed  URINALYSIS, ROUTINE W REFLEX MICROSCOPIC - Abnormal; Notable for the following components:      Result Value   Specific Gravity, Urine 1.002 (*)    All other components within normal limits  LIPASE, BLOOD  COMPREHENSIVE METABOLIC PANEL  CBC    CT ABDOMEN PELVIS W CONTRAST  Final Result      Medications  sodium chloride flush (NS) 0.9 % injection 3 mL (3 mLs Intravenous Given 12/13/19 1847)  iohexol (OMNIPAQUE) 300 MG/ML solution 100 mL (100 mLs Intravenous Contrast Given 12/13/19 2044)     Procedures  /  Critical Care Procedures  ED Course and Medical Decision Making  I have reviewed the triage vital signs, the nursing notes, and pertinent available records from the EMR.  Pertinent labs & imaging results that were available during my care of the patient were reviewed by me and considered in my medical decision making (see below for details).     CT to exclude appendicitis versus right-sided diverticulitis versus colitis.  9:40 PM update: CT reveals 6.3 cm right ovarian cyst, likely explaining patient's pain.  Radiology recommending follow-up ultrasound, however I discussed this case  with Dr. Kennon Rounds of OB/GYN, who agrees that this follow-up ultrasound can be performed as an outpatient given that we are at reduced ultrasound capacity at this time of the evening.  Patient is very agreeable to this plan as she is feeling great and has had no pain since she arrived in the emergency department and she would like to go home.  She has a personal GYN doctor that she will call in the morning.  Barth Kirks. Manny Vitolo,  MD [place] [email or web address]  Final Clinical Impressions(s) / ED Diagnoses     ICD-10-CM   1. Cyst of right ovary  N83.201   2. Right lower quadrant abdominal pain  R10.31     ED Discharge Orders    None       Discharge Instructions Discussed with and Provided to Patient:     Discharge Instructions     You were evaluated in the Emergency Department and after careful evaluation, we did not find any emergent condition requiring admission or further testing in the hospital.  Your exam/testing today is overall reassuring.  Your symptoms seem to be due to an ovarian cyst.  As discussed, we recommend follow-up with your GYN doctor for further evaluation and ultrasound.  Please return to the Emergency Department if you experience any worsening of your condition.  We encourage you to follow up with a primary care provider.  Thank you for allowing [place] to be a part of your care.       Maudie Flakes, MD 12/13/19 2142

## 2019-12-14 ENCOUNTER — Telehealth: Payer: Self-pay | Admitting: Family Medicine

## 2019-12-14 NOTE — Telephone Encounter (Signed)
Chief Complaint Flank Pain Reason for Call Symptomatic / Request for Health Information Initial Comment caller states needs advice with a lot of pain in side/ back and has been very sick. Translation No Nurse Assessment Nurse: [place], RN, Estill Bamberg Date/Time (Eastern Time): 12/13/2019 5:05:58 PM Confirm and document reason for call. If symptomatic, describe symptoms. ---caller states she has some right back, side, abd pain. no injuries, pain is intermittent Has the patient had close contact with a person known or suspected to have the novel coronavirus illness OR traveled / lives in area with major community spread (including international travel) in the last 14 days from the onset of symptoms? * If Asymptomatic, screen for exposure and travel within the last 14 days. ---No Does the patient have any new or worsening symptoms? ---Yes Will a triage be completed? ---Yes Related visit to physician within the last 2 weeks? ---Yes Does the PT have any chronic conditions? (i.e. diabetes, asthma, this includes High risk factors for pregnancy, etc.) ---No Is this a behavioral health or substance abuse call? ---No Guidelines Guideline Title Affirmed Question Affirmed Notes Nurse Date/Time (Eastern Time) Abdominal Pain - Female [1] MODERATE pain (e.g., interferes with normal activities) AND [2] pain comes and goes (cramps) AND [3] present > 24 hours (Exception: pain with Vomiting or Diarrhea - see that Guideline) Humfleet, RN, Estill Bamberg 12/13/2019 5:08:32 PM PLEASE NOTE: All timestamps contained within this report are represented as [place] Standard Time. CONFIDENTIALTY NOTICE: This fax transmission is intended only for the addressee. It contains information that is legally privileged, confidential or otherwise protected from use or disclosure. If you are not the intended recipient, you are strictly prohibited from reviewing, disclosing, copying using or disseminating any of this information or  taking any action in reliance on or regarding this information. If you have received this fax in error, please notify us immediately by telephone so that we can arrange for its return to [place]. Phone: [phone], Toll-Free: [phone], Fax: [phone] Page: 2 of 2 Call Id: [id number] Guidelines Guideline Title Affirmed Question Affirmed Notes Nurse Date/Time Eilene Ghazi Time) Flank Pain [1] Abdominal pain AND [2] age > 48 Humfleet, RN, Estill Bamberg 12/13/2019 5:15:15 PM Disp. Time Eilene Ghazi Time) Disposition Final User 12/13/2019 5:15:00 PM See PCP within 24 Hours Hum[place]et, RN, Estill Bamberg 12/13/2019 5:21:14 PM Go to ED Now Yes Hum[place], RN, Estill Bamberg

## 2019-12-20 DIAGNOSIS — R1900 Intra-abdominal and pelvic swelling, mass and lump, unspecified site: Secondary | ICD-10-CM | POA: Diagnosis not present

## 2019-12-20 DIAGNOSIS — N951 Menopausal and female climacteric states: Secondary | ICD-10-CM | POA: Diagnosis not present

## 2019-12-20 DIAGNOSIS — N952 Postmenopausal atrophic vaginitis: Secondary | ICD-10-CM | POA: Diagnosis not present

## 2019-12-27 ENCOUNTER — Other Ambulatory Visit: Payer: Self-pay

## 2019-12-27 ENCOUNTER — Ambulatory Visit (INDEPENDENT_AMBULATORY_CARE_PROVIDER_SITE_OTHER): Payer: Medicare Other

## 2019-12-27 DIAGNOSIS — Z0000 Encounter for general adult medical examination without abnormal findings: Secondary | ICD-10-CM | POA: Diagnosis not present

## 2019-12-27 NOTE — Patient Instructions (Addendum)
[name] , Thank you for taking time to come for your Medicare Wellness Visit. I appreciate your ongoing commitment to your health goals. Please review the following plan we discussed and let me know if I can assist you in the future.   Screening recommendations/referrals: Colorectal Screening: up to date; last colonoscopy 04/05/19 with Dr. Fuller Plan Mammogram: up to date; last 11/02/19 Bone Density: up to date; last 11/02/19  Vision and Dental Exams: Recommended annual ophthalmology exams for early detection of glaucoma and other disorders of the eye Recommended annual dental exams for proper oral hygiene  Vaccinations: Influenza vaccine: completed  Pneumococcal vaccine: Pneumovax received 05/15/10; Prevnar recommended  Tdap vaccine: up to date; last 06/27/12 Shingles vaccine:  You may receive this vaccine at your local pharmacy. (see handout)   Advanced directives: Please bring a copy of your POA (Power of Attorney) and/or Living Will to your next appointment.  Goals: Recommend to drink at least 6-8 8oz glasses of water per day and consume a balanced diet rich in fresh fruits and vegetables.   Next appointment: Please schedule your Annual Wellness Visit with your Nurse Health Advisor in one year.  Preventive Care 40-64 Years, Female Preventive care refers to lifestyle choices and visits with your health care provider that can promote health and wellness. What does preventive care include?  A yearly physical exam. This is also called an annual well check.  Dental exams once or twice a year.  Routine eye exams. Ask your health care provider how often you should have your eyes checked.  Personal lifestyle choices, including:  Daily care of your teeth and gums.  Regular physical activity.  Eating a healthy diet.  Avoiding tobacco and drug use.  Limiting alcohol use.  Practicing safe sex.  Taking low-dose aspirin daily starting at age 48 if recommended by your health care  provider.  Taking vitamin and mineral supplements as recommended by your health care provider. What happens during an annual well check? The services and screenings done by your health care provider during your annual well check will depend on your age, overall health, lifestyle risk factors, and family history of disease. Counseling  Your health care provider may ask you questions about your:  Alcohol use.  Tobacco use.  Drug use.  Emotional well-being.  Home and relationship well-being.  Sexual activity.  Eating habits.  Work and work Statistician.  Method of birth control.  Menstrual cycle.  Pregnancy history. Screening  You may have the following tests or measurements:  Height, weight, and BMI.  Blood pressure.  Lipid and cholesterol levels. These may be checked every 5 years, or more frequently if you are over 25 years old.  Skin check.  Lung cancer screening. You may have this screening every year starting at age 18 if you have a 30-pack-year history of smoking and currently smoke or have quit within the past 15 years.  Fecal occult blood test (FOBT) of the stool. You may have this test every year starting at age 55.  Flexible sigmoidoscopy or colonoscopy. You may have a sigmoidoscopy every 5 years or a colonoscopy every 10 years starting at age 25.  Hepatitis C blood test.  Hepatitis B blood test.  Sexually transmitted disease (STD) testing.  Diabetes screening. This is done by checking your blood sugar (glucose) after you have not eaten for a while (fasting). You may have this done every 1-3 years.  Mammogram. This may be done every 1-2 years. Talk to your health care provider  about when you should start having regular mammograms. This may depend on whether you have a family history of breast cancer.  BRCA-related cancer screening. This may be done if you have a family history of breast, ovarian, tubal, or peritoneal cancers.  Pelvic exam and Pap test.  This may be done every 3 years starting at age 71. Starting at age 32, this may be done every 5 years if you have a Pap test in combination with an HPV test.  Bone density scan. This is done to screen for osteoporosis. You may have this scan if you are at high risk for osteoporosis. Discuss your test results, treatment options, and if necessary, the need for more tests with your health care provider. Vaccines  Your health care provider may recommend certain vaccines, such as:  Influenza vaccine. This is recommended every year.  Tetanus, diphtheria, and acellular pertussis (Tdap, Td) vaccine. You may need a Td booster every 10 years.  Zoster vaccine. You may need this after age 62.  Pneumococcal 13-valent conjugate (PCV13) vaccine. You may need this if you have certain conditions and were not previously vaccinated.  Pneumococcal polysaccharide (PPSV23) vaccine. You may need one or two doses if you smoke cigarettes or if you have certain conditions. Talk to your health care provider about which screenings and vaccines you need and how often you need them. This information is not intended to replace advice given to you by your health care provider. Make sure you discuss any questions you have with your health care provider. Document Released: 10/17/2015 Document Revised: 06/09/2016 Document Reviewed: 07/22/2015 Elsevier Interactive Patient Education  2017 [place] Prevention in the Home Falls can cause injuries. They can happen to people of all ages. There are many things you can do to make your home safe and to help prevent falls. What can I do on the outside of my home?  Regularly fix the edges of walkways and driveways and fix any cracks.  Remove anything that might make you trip as you walk through a door, such as a raised step or threshold.  Trim any bushes or trees on the path to your home.  Use bright outdoor lighting.  Clear any walking paths of anything that  might make someone trip, such as rocks or tools.  Regularly check to see if handrails are loose or broken. Make sure that both sides of any steps have handrails.  Any raised decks and porches should have guardrails on the edges.  Have any leaves, snow, or ice cleared regularly.  Use sand or salt on walking paths during winter.  Clean up any spills in your garage right away. This includes oil or grease spills. What can I do in the bathroom?  Use night lights.  Install grab bars by the toilet and in the tub and shower. Do not use towel bars as grab bars.  Use non-skid mats or decals in the tub or shower.  If you need to sit down in the shower, use a plastic, non-slip stool.  Keep the floor dry. Clean up any water that spills on the floor as soon as it happens.  Remove soap buildup in the tub or shower regularly.  Attach bath mats securely with double-sided non-slip rug tape.  Do not have throw rugs and other things on the floor that can make you trip. What can I do in the bedroom?  Use night lights.  Make sure that you have a light by your  bed that is easy to reach.  Do not use any sheets or blankets that are too big for your bed. They should not hang down onto the floor.  Have a firm chair that has side arms. You can use this for support while you get dressed.  Do not have throw rugs and other things on the floor that can make you trip. What can I do in the kitchen?  Clean up any spills right away.  Avoid walking on wet floors.  Keep items that you use a lot in easy-to-reach places.  If you need to reach something above you, use a strong step stool that has a grab bar.  Keep electrical cords out of the way.  Do not use floor polish or wax that makes floors slippery. If you must use wax, use non-skid floor wax.  Do not have throw rugs and other things on the floor that can make you trip. What can I do with my stairs?  Do not leave any items on the stairs.  Make  sure that there are handrails on both sides of the stairs and use them. Fix handrails that are broken or loose. Make sure that handrails are as long as the stairways.  Check any carpeting to make sure that it is firmly attached to the stairs. Fix any carpet that is loose or worn.  Avoid having throw rugs at the top or bottom of the stairs. If you do have throw rugs, attach them to the floor with carpet tape.  Make sure that you have a light switch at the top of the stairs and the bottom of the stairs. If you do not have them, ask someone to add them for you. What else can I do to help prevent falls?  Wear shoes that:  Do not have high heels.  Have rubber bottoms.  Are comfortable and fit you well.  Are closed at the toe. Do not wear sandals.  If you use a stepladder:  Make sure that it is fully opened. Do not climb a closed stepladder.  Make sure that both sides of the stepladder are locked into place.  Ask someone to hold it for you, if possible.  Clearly mark and make sure that you can see:  Any grab bars or handrails.  First and last steps.  Where the edge of each step is.  Use tools that help you move around (mobility aids) if they are needed. These include:  Canes.  Walkers.  Scooters.  Crutches.  Turn on the lights when you go into a dark area. Replace any light bulbs as soon as they burn out.  Set up your furniture so you have a clear path. Avoid moving your furniture around.  If any of your floors are uneven, fix them.  If there are any pets around you, be aware of where they are.  Review your medicines with your doctor. Some medicines can make you feel dizzy. This can increase your chance of falling. Ask your doctor what other things that you can do to help prevent falls. This information is not intended to replace advice given to you by your health care provider. Make sure you discuss any questions you have with your health care provider. Document  Released: 07/17/2009 Document Revised: 02/26/2016 Document Reviewed: 10/25/2014 Elsevier Interactive Patient Education  2017 [employer].

## 2019-12-27 NOTE — Progress Notes (Signed)
This visit is being conducted via phone call due to the COVID-19 pandemic. This patient has given me verbal consent via phone to conduct this visit, patient states they are participating from their home address. Some vital signs may be absent or patient reported.   Patient identification: identified by name, DOB, and current address.  Location provider: [facility], Office Persons participating in the virtual visit: Denman George LPN, patient, and Dr. Orma Flaming   Subjective:   Alyssa Love is a 69 y.o. female who presents for an Initial Medicare Annual Wellness Visit.  Review of Systems     Cardiac Risk Factors include: advanced age (>4men, >52 women);dyslipidemia;smoking/ tobacco exposure    Objective:    There were no vitals filed for this visit. There is no height or weight on file to calculate BMI.  Advanced Directives 12/27/2019 12/13/2019 08/25/2018 08/18/2018  Does Patient Have a Medical Advance Directive? Yes No No No  Type of Paramedic of [place];Living will - - -  Does patient want to make changes to medical advance directive? No - Patient declined - - -  Copy of [place] in Chart? No - copy requested - - -  Would patient like information on creating a medical advance directive? - Yes (ED - Information included in AVS) No - Patient declined -    Current Medications (verified) Outpatient Encounter Medications as of 12/27/2019  Medication Sig  . Ascorbic Acid (VITAMIN C) 100 MG tablet Take 100 mg by mouth daily.   Marland Kitchen aspirin EC 81 MG tablet Take 81 mg by mouth daily.  Marland Kitchen b complex vitamins tablet Take 1 tablet by mouth daily.  . calcium carbonate (OS-CAL) 600 MG TABS Take 600 mg by mouth daily.  . cholecalciferol (VITAMIN D) 1000 UNITS tablet Take 1,000 Units by mouth daily.  Marland Kitchen ELDERBERRY PO Take by mouth. With 7 added zinc daily  . Magnesium 500 MG CAPS Take by mouth.  . Omega-3 Fatty Acids (FISH OIL) 1000 MG CAPS  1,200 mg.   . OVER THE COUNTER MEDICATION daily. Tumeric and Curcumin  . OVER THE COUNTER MEDICATION Multi Collagan tablet  . PREBIOTIC PRODUCT PO Take by mouth daily.  Marland Kitchen PREMARIN vaginal cream Place 0.5 g vaginally 2 (two) times a week.   . Probiotic Product (PROBIOTIC-10 PO) Take by mouth.  . Vitamin E 100 UNIT/GM CREA Apply 100 Units topically daily.   . Zinc 30 MG CAPS Take by mouth.   No facility-administered encounter medications on file as of 12/27/2019.    Allergies (verified) Codeine and Hydrocodone-acetaminophen   History: Past Medical History:  Diagnosis Date  . Allergy    very mild- pollen   . Blood transfusion without reported diagnosis    35 yrs ago   . Cataract    removed right  age 19, left 50   . Chicken pox   . Colon polyp   . Complication of anesthesia   . Hyperlipidemia   . Left foot pain   . PONV (postoperative nausea and vomiting)   . Scoliosis   . Smoker    Past Surgical History:  Procedure Laterality Date  . CATARACT EXTRACTION, BILATERAL     age 69 and 89   . CHOLECYSTECTOMY  11-27-53  . COLONOSCOPY    . CYST EXCISION Right 08/25/2018   Procedure: EXCISE CONJUNCTIVAL CYST;  Surgeon: Everitt Amber, MD;  Location: [facility];  Service: Ophthalmology;  Laterality: Right;  . dental implant    .  POLYPECTOMY    . STRABISMUS SURGERY Left 08/25/2018   Procedure: REPAIR STRABISMUS LEFT EYE;  Surgeon: Everitt Amber, MD;  Location: [facility];  Service: Ophthalmology;  Laterality: Left;- revision 2020  . WISDOM TOOTH EXTRACTION     Family History  Problem Relation Age of Onset  . Stroke Father 53  . Prostate cancer Father   . Hypertension Father   . Lung cancer Mother   . Colon polyps Mother   . Cancer Maternal Grandmother   . Colon cancer Maternal Grandmother   . Cancer Paternal Grandmother   . Heart attack Paternal Grandfather   . Esophageal cancer Neg Hx   . Rectal cancer Neg Hx   . Stomach cancer Neg Hx     Social History   Socioeconomic History  . Marital status: Divorced    Spouse name: Not on file  . Number of children: Not on file  . Years of education: Not on file  . Highest education level: Not on file  Occupational History    Employer: [employer]  Tobacco Use  . Smoking status: Current Every Day Smoker    Packs/day: 0.50    Types: Cigarettes    Start date: 03/24/1968  . Smokeless tobacco: Never Used  Substance and Sexual Activity  . Alcohol use: Yes    Alcohol/week: 4.0 standard drinks    Types: 4 Glasses of wine per week  . Drug use: No  . Sexual activity: Not on file  Other Topics Concern  . Not on file  Social History Narrative   Enjoys gardening, walking, and cooking    Social Determinants of Health   Financial Resource Strain:   . Difficulty of Paying Living Expenses:   Food Insecurity:   . Worried About Charity fundraiser in the Last Year:   . Arboriculturist in the Last Year:   Transportation Needs:   . Film/video editor (Medical):   Marland Kitchen Lack of Transportation (Non-Medical):   Physical Activity:   . Days of Exercise per Week:   . Minutes of Exercise per Session:   Stress:   . Feeling of Stress :   Social Connections:   . Frequency of Communication with Friends and Family:   . Frequency of Social Gatherings with Friends and Family:   . Attends Religious Services:   . Active Member of Clubs or Organizations:   . Attends Archivist Meetings:   Marland Kitchen Marital Status:     Tobacco Counseling Ready to quit: Not Answered Counseling given: Not Answered   Clinical Intake:  Pre-visit preparation completed: Yes  Pain : No/denies pain  Diabetes: No  How often do you need to have someone help you when you read instructions, pamphlets, or other written materials from your doctor or pharmacy?: 1 - Never  Interpreter Needed?: No  Information entered by :: Denman George LPN   Activities of Daily Living In your present state of health, do  you have any difficulty performing the following activities: 12/27/2019 12/05/2019  Hearing? N N  Vision? N N  Difficulty concentrating or making decisions? N N  Walking or climbing stairs? N N  Dressing or bathing? N N  Doing errands, shopping? N N  Preparing Food and eating ? N -  Using the Toilet? N -  In the past six months, have you accidently leaked urine? N -  Do you have problems with loss of bowel control? N -  Managing your Medications? N -  Managing your Finances? N -  Housekeeping or managing your Housekeeping? N -  Some recent data might be hidden     Immunizations and Health Maintenance Immunization History  Administered Date(s) Administered  . Influenza Split 07/18/2013  . Influenza Whole 06/20/2012, 07/04/2019  . Influenza,inj,Quad PF,6+ Mos 06/29/2018  . Pneumococcal Polysaccharide-23 08/01/2003, 05/15/2010  . Td 08/01/2003  . Tdap 06/27/2012   Health Maintenance Due  Topic Date Due  . PNA vac Low Risk Adult (1 of 2 - PCV13) 03/25/2015    Patient Care Team: Orma Flaming, MD as PCP - General (Family Medicine) Paula Compton, MD as Consulting Physician (Obstetrics and Gynecology) Ladene Artist, MD as Consulting Physician (Gastroenterology)  Indicate any recent Medical Services you may have received from other than [facility] providers in the past year (date may be approximate).     Assessment:   This is a routine wellness examination for [place].  Hearing/Vision screen No exam data present  Dietary issues and exercise activities discussed: Current Exercise Habits: Home exercise routine, Type of exercise: walking, Time (Minutes): 30, Frequency (Times/Week): 3, Weekly Exercise (Minutes/Week): 90, Intensity: Mild  Goals   None    Depression Screen PHQ 2/9 Scores 12/27/2019 12/05/2019 11/13/2018  PHQ - 2 Score 0 0 0  PHQ- 9 Score - 0 0    Fall Risk Fall Risk  12/27/2019 12/05/2019 11/13/2018 11/13/2018  Falls in the past year? 0 0 0 0  Number falls in  past yr: 0 - 0 0  Injury with Fall? 0 - 0 0  Follow up Falls evaluation completed;Education provided;Falls prevention discussed - - -    Is the patient's home free of loose throw rugs in walkways, pet beds, electrical cords, etc?   yes      Grab bars in the bathroom? yes      Handrails on the stairs?   yes      Adequate lighting?   yes   Cognitive Function:     6CIT Screen 12/27/2019  What Year? 0 points  What month? 0 points  What time? 0 points  Count back from 20 0 points  Months in reverse 0 points  Repeat phrase 0 points  Total Score 0    Screening Tests Health Maintenance  Topic Date Due  . PNA vac Low Risk Adult (1 of 2 - PCV13) 03/25/2015  . MAMMOGRAM  11/01/2021  . TETANUS/TDAP  06/27/2022  . COLONOSCOPY  04/04/2024  . DEXA SCAN  11/01/2024  . INFLUENZA VACCINE  Completed  . Hepatitis C Screening  Completed    Qualifies for Shingles Vaccine? Discussed and patient will check with pharmacy for coverage.  Patient education handout provided   Cancer Screenings: Lung: Low Dose CT Chest recommended if Age 87-80 years, 30 pack-year currently smoking OR have quit w/in 15years. Patient does qualify. (declines at this time)  Breast: Up to date on Mammogram? Yes   Up to date of Bone Density/Dexa? Yes Colorectal: colonoscopy 04/05/19    Plan:  I have personally reviewed and addressed the Medicare Annual Wellness questionnaire and have noted the following in the patient's chart:  A. Medical and social history B. Use of alcohol, tobacco or illicit drugs  C. Current medications and supplements D. Functional ability and status E.  Nutritional status F.  Physical activity G. Advance directives H. List of other physicians I.  Hospitalizations, surgeries, and ER visits in previous 12 months J.  [place] such as hearing and vision if needed, cognitive and  depression L. Referrals, records requested, and appointments- none   In addition, I have reviewed and  discussed with patient certain preventive protocols, quality metrics, and best practice recommendations. A written personalized care plan for preventive services as well as general preventive health recommendations were provided to patient.   Signed,  Denman George, LPN  Nurse Health Advisor   Nurse Notes: no additional

## 2020-01-16 NOTE — Patient Instructions (Addendum)
Get your Covid test at [facility] on 01/19/20 at 9:30 AM   [place]       Your procedure is scheduled on 01/23/20   Report to [facility]  at 6:30 A.M.   Call this number if you have problems the morning of surgery:[phone]   OUR ADDRESS IS [place], WE ARE LOCATED IN THE [facility]ICAL PLA[facility] WITH [facility].   Remember:  Do not eat food or drink liquids after midnight.   Take these medicines the morning of surgery with A SIP OF WATER:none   Do not wear jewelry, make-up or nail polish.  Do not wear lotions, powders, or perfumes, or deoderant.  Do not shave 48 hours prior to surgery.    Do not bring valuables to the hospital.  [facility] is not responsible for any belongings or valuables.  Contacts, dentures or bridgework may not be worn into surgery.  Leave your suitcase in the car.  After surgery it may be brought to your room.  For patients admitted to the hospital, discharge time will be determined by your treatment team.  Patients discharged the day of surgery will not be allowed to drive home.   Special instructions:    Please read over the following fact sheets that you were given:       [facility] - Preparing for Surgery Before surgery, you can play an important role.   Because skin is not sterile, your skin needs to be as free of germs as possible.   You can reduce the number of germs on your skin by washing with CHG (chlorahexidine gluconate) soap before surgery.   CHG is an antiseptic cleaner which kills germs and bonds with the skin to continue killing germs even after washing. Please DO NOT use if you have an allergy to CHG or antibacterial soaps.   If your skin becomes reddened/irritated stop using the CHG and inform your nurse when you arrive at Short Stay. Do not shave (including legs and underarms) for at least 48 hours prior to the first CHG shower.    Please follow these instructions  carefully:  1.  Shower with CHG Soap the night before surgery and the  morning of Surgery.  2.  If you choose to wash your hair, wash your hair first as usual with your  normal  shampoo.  3.  After you shampoo, rinse your hair and body thoroughly to remove the  shampoo.                                        4.  Use CHG as you would any other liquid soap.  You can apply chg directly  to the skin and wash                       Gently with a scrungie or clean washcloth.  5.  Apply the CHG Soap to your body ONLY FROM THE NECK DOWN.   Do not use on face/ open                           Wound or open sores. Avoid contact with eyes, ears mouth and genitals (private parts).  Wash face,  Genitals (private parts) with your normal soap.             6.  Wash thoroughly, paying special attention to the area where your surgery  will be performed.  7.  Thoroughly rinse your body with warm water from the neck down.  8.  DO NOT shower/wash with your normal soap after using and rinsing off  the CHG Soap.             9.  Pat yourself dry with a clean towel.            10.  Wear clean pajamas.            11.  Place clean sheets on your bed the night of your first shower and do not  sleep with pets. Day of Surgery : Do not apply any lotions/deodorants the morning of surgery.  Please wear clean clothes to the hospital/surgery center.  FAILURE TO FOLLOW THESE INSTRUCTIONS MAY RESULT IN THE CANCELLATION OF YOUR SURGERY PATIENT SIGNATURE_________________________________  NURSE SIGNATURE__________________________________  ________________________________________________________________________

## 2020-01-17 ENCOUNTER — Encounter (HOSPITAL_COMMUNITY)
Admission: RE | Admit: 2020-01-17 | Discharge: 2020-01-17 | Disposition: A | Discharge status: Home / Self Care | Payer: Medicare Other | Source: Ambulatory Visit | Attending: Obstetrics and Gynecology | Admitting: Obstetrics and Gynecology

## 2020-01-17 ENCOUNTER — Other Ambulatory Visit: Payer: Self-pay

## 2020-01-17 ENCOUNTER — Encounter (HOSPITAL_COMMUNITY): Payer: Self-pay

## 2020-01-17 DIAGNOSIS — Z01812 Encounter for preprocedural laboratory examination: Secondary | ICD-10-CM | POA: Diagnosis not present

## 2020-01-17 LAB — CBC
HCT: 44.1 % (ref 36.0–46.0)
Hemoglobin: 14.4 g/dL (ref 12.0–15.0)
MCH: 30 pg (ref 26.0–34.0)
MCHC: 32.7 g/dL (ref 30.0–36.0)
MCV: 91.9 fL (ref 80.0–100.0)
Platelets: 261 10*3/uL (ref 150–400)
RBC: 4.8 MIL/uL (ref 3.87–5.11)
RDW: 14.3 % (ref 11.5–15.5)
WBC: 8.1 10*3/uL (ref 4.0–10.5)
nRBC: 0 % (ref 0.0–0.2)

## 2020-01-17 NOTE — Progress Notes (Signed)
PCP - Dr. Hal Hope Cardiologist - no  Chest x-ray - no EKG - no Stress Test - no ECHO - no Cardiac Cath - no  Sleep Study - no CPAP -   Fasting Blood Sugar - NA Checks Blood Sugar _____ times a day  Blood Thinner Instructions:ASA 81 as preventative Aspirin Instructions:none Last Dose:01/17/20  Anesthesia review:   Patient denies shortness of breath, fever, cough and chest pain at PAT appointment yes  Patient verbalized understanding of instructions that were given to them at the PAT appointment. Patient was also instructed that they will need to review over the PAT instructions again at home before surgery. yes

## 2020-01-18 LAB — ABO/RH: ABO/RH(D): O NEG

## 2020-01-19 ENCOUNTER — Other Ambulatory Visit (HOSPITAL_COMMUNITY)
Admission: RE | Admit: 2020-01-19 | Discharge: 2020-01-19 | Disposition: A | Discharge status: Home / Self Care | Payer: Medicare Other | Source: Ambulatory Visit | Attending: Obstetrics and Gynecology | Admitting: Obstetrics and Gynecology

## 2020-01-19 DIAGNOSIS — Z20822 Contact with and (suspected) exposure to covid-19: Secondary | ICD-10-CM | POA: Diagnosis not present

## 2020-01-19 DIAGNOSIS — Z01812 Encounter for preprocedural laboratory examination: Secondary | ICD-10-CM | POA: Diagnosis not present

## 2020-01-19 LAB — SARS CORONAVIRUS 2 (TAT 6-24 HRS): SARS Coronavirus 2: NEGATIVE

## 2020-01-22 NOTE — H&P (Addendum)
Alyssa Love is an 69 y.o. female G1P1 who presents for scheduled TLH/LSO and resection of large right adnexal cyst. She had abdominal pain beginning in March that took her to ED where CT scan showed a 6.3 cm benign appearing cyst in the right adnexa.  Since then she has had intermittent significant  bouts of pain and would like definitive surgical therapy.  A Ca-125 was WNL and the lesion appears benign.     She also  has a long h/o abnormal paps which are difficult to surveillance with her cervical stenosis, so reasonably wishes a concurrent hysterectomy.  Her last pap and colpo were c/w CIN 1   Pertinent Gynecological History:   OB History: C/S x 1   Menstrual History:  No LMP recorded. Patient is postmenopausal.    Past Medical History:  Diagnosis Date  . Allergy    very mild- pollen   . Blood transfusion without reported diagnosis    35 yrs ago   . Cataract    removed right  age 12, left 14   . Chicken pox   . Colon polyp   . Complication of anesthesia   . Hyperlipidemia   . Left foot pain   . PONV (postoperative nausea and vomiting)   . Scoliosis   . Smoker     Past Surgical History:  Procedure Laterality Date  . CATARACT EXTRACTION, BILATERAL     age 76 and 34   . CHOLECYSTECTOMY  11-27-53  . COLONOSCOPY    . CYST EXCISION Right 08/25/2018   Procedure: EXCISE CONJUNCTIVAL CYST;  Surgeon: Everitt Amber, MD;  Location: [facility];  Service: Ophthalmology;  Laterality: Right;  . dental implant    . EYE SURGERY    . POLYPECTOMY    . STRABISMUS SURGERY Left 08/25/2018   Procedure: REPAIR STRABISMUS LEFT EYE;  Surgeon: Everitt Amber, MD;  Location: [facility];  Service: Ophthalmology;  Laterality: Left;- revision 2020  . WISDOM TOOTH EXTRACTION      Family History  Problem Relation Age of Onset  . Stroke Father 34  . Prostate cancer Father   . Hypertension Father   . Lung cancer Mother   . Colon polyps Mother   . Cancer  Maternal Grandmother   . Colon cancer Maternal Grandmother   . Cancer Paternal Grandmother   . Heart attack Paternal Grandfather   . Esophageal cancer Neg Hx   . Rectal cancer Neg Hx   . Stomach cancer Neg Hx     Social History:  reports that she has been smoking cigarettes. She started smoking about 51 years ago. She has a 7.50 pack-year smoking history. She has never used smokeless tobacco. She reports current alcohol use of about 4.0 standard drinks of alcohol per week. She reports that she does not use drugs.  Allergies:  Allergies  Allergen Reactions  . Codeine Other (See Comments)    Horrible dreams and hallucinations- She has had codeine cough meds with NO issues   . Hydrocodone-Acetaminophen     REACTION: hallucinations    No medications prior to admission.    Review of Systems  Constitutional: Negative for fever.  Gastrointestinal: Positive for abdominal pain.    There were no vitals taken for this visit. Physical Exam  Constitutional: She is oriented to person, place, and time. She appears well-developed.  Cardiovascular: Normal rate and regular rhythm.  Respiratory: Effort normal.  GI: Soft.  Genitourinary:    Vagina normal.  Genitourinary Comments: Pelvic mass palpable and c/w 6cm R>L   Neurological: She is alert and oriented to person, place, and time.  Psychiatric: She has a normal mood and affect.    No results found for this or any previous visit (from the past 24 hour(s)).  No results found.  Assessment/Plan: Pt desires definitive surgical management by RSO of her ovarian cystic growth and a concurrent TLH/LSO given her h/o of abnormal paps and difficulty in surveillance with cervical stenosis. She was counseled on the risks and benefits of TLH/BSO including bleeding, infection, and possible damage to bowel and bladder or ureters.  She would accept a transfusion if needed.  We reviewed the procedure in detail including laparoscopic vs vaginal closure  of cuff.  We discussed a possible incision in the event of any complication and possible delayed recovery from that. She will place cytotec prior to the procedure to aid in placing the uterine manipulator. She is ready to proceed.  Logan Bores 01/22/2020, 6:33 PM

## 2020-01-23 ENCOUNTER — Ambulatory Visit (HOSPITAL_BASED_OUTPATIENT_CLINIC_OR_DEPARTMENT_OTHER): Payer: Medicare Other | Admitting: Anesthesiology

## 2020-01-23 ENCOUNTER — Ambulatory Visit (HOSPITAL_BASED_OUTPATIENT_CLINIC_OR_DEPARTMENT_OTHER)
Admission: RE | Admit: 2020-01-23 | Discharge: 2020-01-24 | Disposition: A | Discharge status: Home / Self Care | Payer: Medicare Other | Attending: Obstetrics and Gynecology | Admitting: Obstetrics and Gynecology

## 2020-01-23 ENCOUNTER — Encounter (HOSPITAL_BASED_OUTPATIENT_CLINIC_OR_DEPARTMENT_OTHER)
Admission: RE | Disposition: A | Discharge status: Home / Self Care | Payer: Self-pay | Source: Home / Self Care | Attending: Obstetrics and Gynecology

## 2020-01-23 ENCOUNTER — Encounter (HOSPITAL_BASED_OUTPATIENT_CLINIC_OR_DEPARTMENT_OTHER): Payer: Self-pay | Admitting: Obstetrics and Gynecology

## 2020-01-23 DIAGNOSIS — F17210 Nicotine dependence, cigarettes, uncomplicated: Secondary | ICD-10-CM | POA: Diagnosis not present

## 2020-01-23 DIAGNOSIS — Z7982 Long term (current) use of aspirin: Secondary | ICD-10-CM | POA: Insufficient documentation

## 2020-01-23 DIAGNOSIS — N870 Mild cervical dysplasia: Secondary | ICD-10-CM | POA: Diagnosis not present

## 2020-01-23 DIAGNOSIS — N800 Endometriosis of uterus: Secondary | ICD-10-CM | POA: Diagnosis not present

## 2020-01-23 DIAGNOSIS — Z885 Allergy status to narcotic agent status: Secondary | ICD-10-CM | POA: Insufficient documentation

## 2020-01-23 DIAGNOSIS — D270 Benign neoplasm of right ovary: Secondary | ICD-10-CM | POA: Insufficient documentation

## 2020-01-23 DIAGNOSIS — N83292 Other ovarian cyst, left side: Secondary | ICD-10-CM | POA: Insufficient documentation

## 2020-01-23 DIAGNOSIS — R1909 Other intra-abdominal and pelvic swelling, mass and lump: Secondary | ICD-10-CM | POA: Diagnosis not present

## 2020-01-23 DIAGNOSIS — Z90710 Acquired absence of both cervix and uterus: Secondary | ICD-10-CM | POA: Diagnosis present

## 2020-01-23 DIAGNOSIS — N83511 Torsion of right ovary and ovarian pedicle: Secondary | ICD-10-CM | POA: Insufficient documentation

## 2020-01-23 DIAGNOSIS — E785 Hyperlipidemia, unspecified: Secondary | ICD-10-CM | POA: Diagnosis not present

## 2020-01-23 DIAGNOSIS — M419 Scoliosis, unspecified: Secondary | ICD-10-CM | POA: Insufficient documentation

## 2020-01-23 DIAGNOSIS — R1900 Intra-abdominal and pelvic swelling, mass and lump, unspecified site: Secondary | ICD-10-CM | POA: Diagnosis present

## 2020-01-23 DIAGNOSIS — J449 Chronic obstructive pulmonary disease, unspecified: Secondary | ICD-10-CM | POA: Diagnosis not present

## 2020-01-23 HISTORY — PX: TOTAL LAPAROSCOPIC HYSTERECTOMY WITH SALPINGECTOMY: SHX6742

## 2020-01-23 HISTORY — PX: CYSTOSCOPY: SHX5120

## 2020-01-23 LAB — TYPE AND SCREEN
ABO/RH(D): O NEG
Antibody Screen: NEGATIVE

## 2020-01-23 SURGERY — HYSTERECTOMY, TOTAL, LAPAROSCOPIC, WITH SALPINGECTOMY
Anesthesia: General | Site: Ureter

## 2020-01-23 MED ORDER — ACETAMINOPHEN 500 MG PO TABS
1000.0000 mg | ORAL_TABLET | Freq: Once | ORAL | Status: AC
  Administered 2020-01-23: 08:00:00 1000 mg via ORAL

## 2020-01-23 MED ORDER — MENTHOL 3 MG MT LOZG: 1.0000 | LOZENGE | OROMUCOSAL | Status: DC | PRN

## 2020-01-23 MED ORDER — EPHEDRINE SULFATE-NACL 50-0.9 MG/10ML-% IV SOSY
PREFILLED_SYRINGE | INTRAVENOUS | Status: DC | PRN
  Administered 2020-01-23 (×3): 5 mg via INTRAVENOUS
  Administered 2020-01-23: 10 mg via INTRAVENOUS

## 2020-01-23 MED ORDER — OXYCODONE HCL 5 MG PO TABS
ORAL_TABLET | ORAL | Status: AC
  Filled 2020-01-23: qty 1

## 2020-01-23 MED ORDER — ONDANSETRON HCL 4 MG/2ML IJ SOLN
INTRAMUSCULAR | Status: AC
  Filled 2020-01-23: qty 2

## 2020-01-23 MED ORDER — ROCURONIUM BROMIDE 10 MG/ML (PF) SYRINGE
PREFILLED_SYRINGE | INTRAVENOUS | Status: AC
  Filled 2020-01-23: qty 10

## 2020-01-23 MED ORDER — KETOROLAC TROMETHAMINE 30 MG/ML IJ SOLN: 30.0000 mg | Freq: Once | INTRAMUSCULAR | Status: DC | PRN

## 2020-01-23 MED ORDER — IBUPROFEN 800 MG PO TABS
ORAL_TABLET | ORAL | Status: AC
  Filled 2020-01-23: qty 1

## 2020-01-23 MED ORDER — BUPIVACAINE HCL (PF) 0.25 % IJ SOLN
INTRAMUSCULAR | Status: DC | PRN
  Administered 2020-01-23: 22 mL

## 2020-01-23 MED ORDER — LIDOCAINE 2% (20 MG/ML) 5 ML SYRINGE
INTRAMUSCULAR | Status: DC | PRN
  Administered 2020-01-23: 60 mg via INTRAVENOUS

## 2020-01-23 MED ORDER — PHENYLEPHRINE HCL (PRESSORS) 10 MG/ML IV SOLN
INTRAVENOUS | Status: DC | PRN
  Administered 2020-01-23: 120 ug via INTRAVENOUS
  Administered 2020-01-23: 160 ug via INTRAVENOUS
  Administered 2020-01-23: 120 ug via INTRAVENOUS

## 2020-01-23 MED ORDER — SCOPOLAMINE 1 MG/3DAYS TD PT72
MEDICATED_PATCH | TRANSDERMAL | Status: AC
  Filled 2020-01-23: qty 1

## 2020-01-23 MED ORDER — ONDANSETRON HCL 4 MG/2ML IJ SOLN
INTRAMUSCULAR | Status: DC | PRN
  Administered 2020-01-23 (×2): 4 mg via INTRAVENOUS

## 2020-01-23 MED ORDER — OXYCODONE HCL 5 MG PO TABS
5.0000 mg | ORAL_TABLET | ORAL | Status: DC | PRN
  Administered 2020-01-23 – 2020-01-24 (×4): 5 mg via ORAL

## 2020-01-23 MED ORDER — MIDAZOLAM HCL 2 MG/2ML IJ SOLN
INTRAMUSCULAR | Status: DC | PRN
  Administered 2020-01-23: 2 mg via INTRAVENOUS

## 2020-01-23 MED ORDER — IBUPROFEN 800 MG PO TABS
800.0000 mg | ORAL_TABLET | Freq: Three times a day (TID) | ORAL | Status: DC
  Administered 2020-01-23 – 2020-01-24 (×3): 800 mg via ORAL

## 2020-01-23 MED ORDER — KETOROLAC TROMETHAMINE 30 MG/ML IJ SOLN
INTRAMUSCULAR | Status: DC | PRN
  Administered 2020-01-23: 30 mg via INTRAVENOUS

## 2020-01-23 MED ORDER — OXYCODONE HCL 5 MG/5ML PO SOLN: 5.0000 mg | Freq: Once | ORAL | Status: DC | PRN

## 2020-01-23 MED ORDER — LIDOCAINE 2% (20 MG/ML) 5 ML SYRINGE: INTRAMUSCULAR | Status: DC | PRN

## 2020-01-23 MED ORDER — ACETAMINOPHEN 325 MG PO TABS: 650.0000 mg | ORAL_TABLET | ORAL | Status: DC | PRN

## 2020-01-23 MED ORDER — DEXAMETHASONE SODIUM PHOSPHATE 10 MG/ML IJ SOLN
INTRAMUSCULAR | Status: AC
  Filled 2020-01-23: qty 1

## 2020-01-23 MED ORDER — SODIUM CHLORIDE 0.9 % IR SOLN
Status: DC | PRN
  Administered 2020-01-23: 3000 mL

## 2020-01-23 MED ORDER — ROCURONIUM BROMIDE 10 MG/ML (PF) SYRINGE
PREFILLED_SYRINGE | INTRAVENOUS | Status: DC | PRN
  Administered 2020-01-23: 50 mg via INTRAVENOUS

## 2020-01-23 MED ORDER — LIDOCAINE 2% (20 MG/ML) 5 ML SYRINGE
INTRAMUSCULAR | Status: AC
  Filled 2020-01-23: qty 5

## 2020-01-23 MED ORDER — ONDANSETRON HCL 4 MG PO TABS: 4.0000 mg | ORAL_TABLET | Freq: Four times a day (QID) | ORAL | Status: DC | PRN

## 2020-01-23 MED ORDER — ACETAMINOPHEN 500 MG PO TABS
ORAL_TABLET | ORAL | Status: AC
  Filled 2020-01-23: qty 2

## 2020-01-23 MED ORDER — MAGNESIUM HYDROXIDE 400 MG/5ML PO SUSP
30.0000 mL | Freq: Every day | ORAL | Status: DC | PRN
  Filled 2020-01-23: qty 30

## 2020-01-23 MED ORDER — HYDROMORPHONE HCL 1 MG/ML IJ SOLN
0.2500 mg | INTRAMUSCULAR | Status: DC | PRN
  Administered 2020-01-23: 11:00:00 0.5 mg via INTRAVENOUS
  Administered 2020-01-23 (×2): 0.25 mg via INTRAVENOUS

## 2020-01-23 MED ORDER — FENTANYL CITRATE (PF) 250 MCG/5ML IJ SOLN
INTRAMUSCULAR | Status: AC
  Filled 2020-01-23: qty 10

## 2020-01-23 MED ORDER — LIDOCAINE 2% (20 MG/ML) 5 ML SYRINGE
INTRAMUSCULAR | Status: DC | PRN
  Administered 2020-01-23: 1.5 mg/kg/h via INTRAVENOUS

## 2020-01-23 MED ORDER — FLUORESCEIN SODIUM 10 % IV SOLN
INTRAVENOUS | Status: AC
  Filled 2020-01-23: qty 5

## 2020-01-23 MED ORDER — FENTANYL CITRATE (PF) 100 MCG/2ML IJ SOLN
INTRAMUSCULAR | Status: DC | PRN
  Administered 2020-01-23: 100 ug via INTRAVENOUS
  Administered 2020-01-23: 50 ug via INTRAVENOUS

## 2020-01-23 MED ORDER — PROPOFOL 10 MG/ML IV BOLUS
INTRAVENOUS | Status: AC
  Filled 2020-01-23: qty 20

## 2020-01-23 MED ORDER — PHENYLEPHRINE 40 MCG/ML (10ML) SYRINGE FOR IV PUSH (FOR BLOOD PRESSURE SUPPORT)
PREFILLED_SYRINGE | INTRAVENOUS | Status: AC
  Filled 2020-01-23: qty 10

## 2020-01-23 MED ORDER — HYDROMORPHONE HCL 1 MG/ML IJ SOLN
INTRAMUSCULAR | Status: AC
  Filled 2020-01-23: qty 1

## 2020-01-23 MED ORDER — LIDOCAINE 2% (20 MG/ML) 5 ML SYRINGE
INTRAMUSCULAR | Status: AC
  Filled 2020-01-23: qty 10

## 2020-01-23 MED ORDER — ONDANSETRON HCL 4 MG/2ML IJ SOLN: 4.0000 mg | Freq: Four times a day (QID) | INTRAMUSCULAR | Status: DC | PRN

## 2020-01-23 MED ORDER — ARTIFICIAL TEARS OPHTHALMIC OINT
TOPICAL_OINTMENT | OPHTHALMIC | Status: AC
  Filled 2020-01-23: qty 3.5

## 2020-01-23 MED ORDER — EPHEDRINE SULFATE-NACL 50-0.9 MG/10ML-% IV SOSY: PREFILLED_SYRINGE | INTRAVENOUS | Status: DC | PRN

## 2020-01-23 MED ORDER — HYDROMORPHONE HCL 1 MG/ML IJ SOLN: 0.2000 mg | INTRAMUSCULAR | Status: DC | PRN

## 2020-01-23 MED ORDER — FLUORESCEIN SODIUM 10 % IV SOLN
INTRAVENOUS | Status: DC | PRN
  Administered 2020-01-23: 1 mL via INTRAVENOUS

## 2020-01-23 MED ORDER — SUGAMMADEX SODIUM 200 MG/2ML IV SOLN
INTRAVENOUS | Status: DC | PRN
  Administered 2020-01-23: 200 mg via INTRAVENOUS

## 2020-01-23 MED ORDER — KETOROLAC TROMETHAMINE 30 MG/ML IJ SOLN
INTRAMUSCULAR | Status: AC
  Filled 2020-01-23: qty 1

## 2020-01-23 MED ORDER — CEFAZOLIN SODIUM-DEXTROSE 2-4 GM/100ML-% IV SOLN
INTRAVENOUS | Status: AC
  Filled 2020-01-23: qty 100

## 2020-01-23 MED ORDER — EPHEDRINE 5 MG/ML INJ
INTRAVENOUS | Status: AC
  Filled 2020-01-23: qty 10

## 2020-01-23 MED ORDER — SCOPOLAMINE 1 MG/3DAYS TD PT72
1.0000 | MEDICATED_PATCH | TRANSDERMAL | Status: DC
  Administered 2020-01-23: 08:00:00 1.5 mg via TRANSDERMAL

## 2020-01-23 MED ORDER — CEFAZOLIN SODIUM-DEXTROSE 2-4 GM/100ML-% IV SOLN
2.0000 g | INTRAVENOUS | Status: AC
  Administered 2020-01-23: 2 g via INTRAVENOUS
  Filled 2020-01-23: qty 100

## 2020-01-23 MED ORDER — LACTATED RINGERS IV SOLN
INTRAVENOUS | Status: DC
  Filled 2020-01-23: qty 1000

## 2020-01-23 MED ORDER — PROPOFOL 10 MG/ML IV BOLUS
INTRAVENOUS | Status: DC | PRN
  Administered 2020-01-23: 150 mg via INTRAVENOUS

## 2020-01-23 MED ORDER — LACTATED RINGERS IV SOLN: INTRAVENOUS | Status: DC

## 2020-01-23 MED ORDER — MEPERIDINE HCL 25 MG/ML IJ SOLN: 6.2500 mg | INTRAMUSCULAR | Status: DC | PRN

## 2020-01-23 MED ORDER — DEXAMETHASONE SODIUM PHOSPHATE 10 MG/ML IJ SOLN
INTRAMUSCULAR | Status: DC | PRN
  Administered 2020-01-23: 8 mg via INTRAVENOUS

## 2020-01-23 MED ORDER — PROMETHAZINE HCL 25 MG/ML IJ SOLN: 6.2500 mg | INTRAMUSCULAR | Status: DC | PRN

## 2020-01-23 MED ORDER — MIDAZOLAM HCL 2 MG/2ML IJ SOLN
INTRAMUSCULAR | Status: AC
  Filled 2020-01-23: qty 2

## 2020-01-23 MED ORDER — OXYCODONE HCL 5 MG PO TABS: 5.0000 mg | ORAL_TABLET | Freq: Once | ORAL | Status: DC | PRN

## 2020-01-23 MED ORDER — SIMETHICONE 80 MG PO CHEW: 80.0000 mg | CHEWABLE_TABLET | Freq: Four times a day (QID) | ORAL | Status: DC | PRN

## 2020-01-23 SURGICAL SUPPLY — 47 items
COVER BACK TABLE 60X90IN (DRAPES) ×4 IMPLANT
COVER MAYO STAND STRL (DRAPES) ×4 IMPLANT
COVER WAND RF STERILE (DRAPES) ×4 IMPLANT
DERMABOND ADVANCED (GAUZE/BANDAGES/DRESSINGS) ×2
DERMABOND ADVANCED .7 DNX12 (GAUZE/BANDAGES/DRESSINGS) ×2 IMPLANT
DEVICE SUTURE ENDOST 10MM (ENDOMECHANICALS) ×4 IMPLANT
DILATOR CANAL MILEX (MISCELLANEOUS) ×4 IMPLANT
DURAPREP 26ML APPLICATOR (WOUND CARE) ×4 IMPLANT
GAUZE 4X4 16PLY RFD (DISPOSABLE) ×4 IMPLANT
GLOVE BIO SURGEON STRL SZ7 (GLOVE) ×8 IMPLANT
GLOVE BIOGEL PI IND STRL 7.0 (GLOVE) ×4 IMPLANT
GLOVE BIOGEL PI IND STRL 8.5 (GLOVE) ×8 IMPLANT
GLOVE BIOGEL PI INDICATOR 7.0 (GLOVE) ×4
GLOVE BIOGEL PI INDICATOR 8.5 (GLOVE) ×8
GLOVE ORTHO TXT STRL SZ7.5 (GLOVE) ×4 IMPLANT
GLOVE SURG SS PI 8.5 STRL IVOR (GLOVE) ×4
GLOVE SURG SS PI 8.5 STRL STRW (GLOVE) ×4 IMPLANT
HOLDER FOLEY CATH W/STRAP (MISCELLANEOUS) ×4 IMPLANT
NEEDLE INSUFFLATION 120MM (ENDOMECHANICALS) ×4 IMPLANT
NEEDLE SPNL 18GX3.5 QUINCKE PK (NEEDLE) ×4 IMPLANT
NS IRRIG 1000ML POUR BTL (IV SOLUTION) ×4 IMPLANT
OCCLUDER COLPOPNEUMO (BALLOONS) ×4 IMPLANT
PACK LAPAROSCOPY BASIN (CUSTOM PROCEDURE TRAY) ×4 IMPLANT
PACK ROBOTIC GOWN (GOWN DISPOSABLE) ×4 IMPLANT
PACK TRENDGUARD 450 HYBRID PRO (MISCELLANEOUS) ×2 IMPLANT
POUCH LAPAROSCOPIC INSTRUMENT (MISCELLANEOUS) ×4 IMPLANT
POUCH SPECIMEN RETRIEVAL 10MM (ENDOMECHANICALS) ×4 IMPLANT
SET IRRIG TUBING LAPAROSCOPIC (IRRIGATION / IRRIGATOR) ×4 IMPLANT
SET IRRIG Y TYPE TUR BLADDER L (SET/KITS/TRAYS/PACK) ×4 IMPLANT
SET TRI-LUMEN FLTR TB AIRSEAL (TUBING) ×4 IMPLANT
SET TUBE SMOKE EVAC HIGH FLOW (TUBING) IMPLANT
SHEARS HARMONIC ACE PLUS 36CM (ENDOMECHANICALS) ×4 IMPLANT
SUT ENDO VLOC 180-0-8IN (SUTURE) ×4 IMPLANT
SUT VIC AB 0 CT1 36 (SUTURE) ×8 IMPLANT
SUT VIC AB 4-0 PS2 27 (SUTURE) ×4 IMPLANT
SUT VICRYL 0 UR6 27IN ABS (SUTURE) ×4 IMPLANT
SYR 10ML LL (SYRINGE) ×8 IMPLANT
SYR 50ML LL SCALE MARK (SYRINGE) ×4 IMPLANT
SYSTEM CARTER THOMASON II (TROCAR) IMPLANT
TIP UTERINE 5.1X6CM LAV DISP (MISCELLANEOUS) ×4 IMPLANT
TOWEL OR 17X26 10 PK STRL BLUE (TOWEL DISPOSABLE) ×4 IMPLANT
TRAY FOLEY W/BAG SLVR 14FR (SET/KITS/TRAYS/PACK) ×4 IMPLANT
TRENDGUARD 450 HYBRID PRO PACK (MISCELLANEOUS) ×4
TROCAR BLADELESS OPT 5 100 (ENDOMECHANICALS) ×8 IMPLANT
TROCAR PORT AIRSEAL 8X120 (TROCAR) ×4 IMPLANT
TROCAR XCEL NON-BLD 11X100MML (ENDOMECHANICALS) ×8 IMPLANT
WARMER LAPAROSCOPE (MISCELLANEOUS) ×4 IMPLANT

## 2020-01-23 NOTE — Discharge Summary (Signed)
Physician Discharge Summary  Patient ID: Alyssa Love MRN: [id number] DOB/AGE: 1951-08-28 69 y.o.  Admit date: 01/23/2020 Discharge date: 01/24/2020  Admission Diagnoses: Pelvic Mass CIN 1 of cervix  Discharge Diagnoses:  Right ovarian cystic mass CIN 1 of cervix  Active Problems:   S/P laparoscopic hysterectomy BSO/resection pelvic mass and cystoscopy  Discharged Condition: good  Hospital Course: Pt was admitted to extended observation post-surgery and did well.  By post-operative Day #1 she was ambulating, tolerating po meds and voiding without problem.  She was eating well with no nausea and abdomen was soft and non-distended.  She was d/c home with instructions and prescriptions  Consults: None  Significant Diagnostic Studies: labs: CBC  Treatments: surgery: TLH/BSO/resection mass/cystoscopy  Discharge Exam: Blood pressure 133/73, pulse 72, temperature 98.8 F (37.1 C), resp. rate 18, height 5\' 2"  (1.575 m), weight 61.6 kg, SpO2 95 %. General appearance: alert and cooperative GI: soft NT and incisions clear except some mild bruising  Disposition: Discharge disposition: 01-Home or Self Care       Discharge Instructions    Call MD for:  persistant nausea and vomiting   Complete by: As directed    Call MD for:  redness, tenderness, or signs of infection (pain, swelling, redness, odor or green/yellow discharge around incision site)   Complete by: As directed    Call MD for:  severe uncontrolled pain   Complete by: As directed    Call MD for:  temperature >100.4   Complete by: As directed    Diet - low sodium heart healthy   Complete by: As directed    Discharge instructions   Complete by: As directed    Avoid driving for 3-4 days  or until off narcotic pain meds.  No heavy lifting greater than 10 lbs.  Nothing in vagina for 6 weeks.  Shower over incision and pat dry     Allergies as of 01/24/2020      Reactions   Codeine Other (See Comments)   Horrible  dreams and hallucinations- She has had codeine cough meds with NO issues    Hydrocodone-acetaminophen    REACTION: hallucinations      Medication List    TAKE these medications   acetaminophen 325 MG tablet Commonly known as: TYLENOL Take 2 tablets (650 mg total) by mouth every 4 (four) hours as needed for mild pain (temperature > 101.5.).   aspirin EC 81 MG tablet Take 81 mg by mouth at bedtime.   b complex vitamins tablet Take 1 tablet by mouth daily.   COLLAGEN PO Take 3,300 mg by mouth daily. Complex   ELDERBERRY PO Take 100 mg by mouth daily. Gummies   Fish Oil 1200 MG Caps Take 1,200 mg by mouth daily.   ibuprofen 800 MG tablet Commonly known as: ADVIL Take 1 tablet (800 mg total) by mouth every 8 (eight) hours.   Magnesium 500 MG Caps Take 500 mg by mouth daily.   NAC PO Take 3,000 mg by mouth daily.   oxyCODONE 5 MG immediate release tablet Commonly known as: Oxy IR/ROXICODONE Take 1-2 tablets (5-10 mg total) by mouth every 4 (four) hours as needed for moderate pain.   PROBIOTIC-PREBIOTIC PO Take 1 tablet by mouth daily.   TURMERIC PO Take 1,500 mg by mouth daily.   vitamin A 3 MG (10000 UNITS) capsule Take 10,000 Units by mouth daily.   vitamin C 1000 MG tablet Take 1,000 mg by mouth in the morning and at bedtime.  Vitamin D 125 MCG (5000 UT) Caps Take 10,000 Units by mouth daily.   vitamin E 180 MG (400 UNITS) capsule Generic drug: vitamin E Take 400 Units by mouth daily.   Zinc 50 MG Tabs Take 50 mg by mouth daily.      Follow-up Information    Paula Compton, MD. Schedule an appointment as soon as possible for a visit in 2 week(s).   Specialty: Obstetrics and Gynecology Why: Incision check Contact information: [address] [place] [address] [phone]           Signed: Logan Bores 01/24/2020, 8:30 AM

## 2020-01-23 NOTE — Op Note (Signed)
Operative Note  Dr. Paulene Floor assistance was required to retract and achieve visibility to perform the surgery   Preoperative Diagnosis Pelvic Mass Persistent CIN 1 of cervix   Postoperative Diagnosis Right ovarian 6cm cyst with torsion Normal appearing left ovary tube and uterus   Procedure Total laparoscopic hysterectomy with bilateral salpingectomies and resection right adnexal cystic mass Cystoscopy  Surgeon Paula Compton, MD Cheri Fowler, MD  Anesthesia GETA  Fluids: EBL 60mL  UOP 158mL clear [id number] LR  Findings Large right cystic appearing mass of right ovary with torsion noted.  Uterus tubes and left ovary WNL  Specimen Uterus, cervix, right ovary with cyst (decompressed in endobag), left ovary and bilateral fallopian tubes  Procedure Note Patient was taken to the operating room where general anesthesia was obtained without difficulty she was prepped and draped in the normal sterile fashion in the dorsal lithotomy position. An appropriate time out was performed. A 6.2 Rumi with cervical cup was placed after dilation The vaginal occluder was filled and a Foley catheter placed in the bladder. Attention was then turned to the abdomen where of infraumbilical incision was made after injection with quarter percent Marcaine approximate 2 cm in width. The fascia was then identified and grasped with Coker clamps and elevated. The fascia was then opened sharply with Mayo scissors and intraperitoneal placement of the Sheryle Hail was performed, after a pursestring suture of 0 Vicryl was placed around the fascial edge. With the [facility] in place the pneumoperitoneum was obtained and the pelvis and abdomen inspected with findings as previously stated. Two additional  ports were placed under direct visualization from the lateral upper quadrants. An 34mm airseal port was placed on the right and an eleven mm port on the left.   Each port site was injected with quarter percent Marcaine  prior placement.   With patient in Trendelenburg the uterus and tubes and ovaries were inspected with findings as previously stated. The Harmonic scalpel was then utilized to dissect the left infundibulopelvic ligament and broad ligament from the pelvic sidewall down to the level of the cervix. The round ligament were then also taken down with the Harmonic to the level of the bladder flap.  The bladder flap was taken down from the lower uterine segment and pushed away to expose the cervix.  The uterine artery was then taken down with no difficulty.  In a similar fashion the right ovarian cystic mass and tube were taken down from the sidewall with the harmonic scalpel.  The uterine artery was exposed and taken down with no bleeding.  The vaginal cuff identified and opened over the vaginal ring.  The cuff was then completely opened over the ring until the uterus was completely free. Since the cystic mass was large, is was then freed from the uterus with the harmoic scalpel and placed in the cul-de-sac.   It was then pulled down into the vagina.  The vaginal cuff was then closed with a running v-lock suture with good closure.The cuff and pedicles were hemostatic and the ureters visualized and normal in appearance. An endobag was then introduced into the [employer] port and the cystic mass placed in the bag.  The bag was then pulled up to the [place] and the trocar removed.  The bag was partially pulled through and a spinal needle used to decompress the mass of 151ml of clear yellow fluid.  The cyst was then pulled out inside the bag with the contents controlled.   A four quadrant view of the  pelvis and abdomen was performed and found to be normal with no bleeding or injuries noted.  The instruments were removed from the abdomen as well as the lateral ports under visualization.  The pneumoperitoneum was reduced through the trocar. The trocar was finally removed and the infraumbilical incision and lateral incisions were  closed.  The umbilical pursestring suture was tied down with adequate closure. The left lateral port was closed with a deep suture of 0 vicryl and with a subcuticular stitch of 3-0 Vicryl. The 7mm port was closed with a subcuticular stitch. Dermabond and a bandage were placed.  Attention was turned below where cystoscopy was performed with no sutures noted in the bladder and normal ureteral efflux from both ureteral orifices.  The camera was removed.All instrument and sponge counts were correct.  Patient was then awakened and taken to the recovery room in good condition.

## 2020-01-23 NOTE — Anesthesia Preprocedure Evaluation (Addendum)
Anesthesia Evaluation  Patient identified by MRN, date of birth, ID band Patient awake    Reviewed: Allergy & Precautions, H&P , NPO status , Patient's Chart, lab work & pertinent test results, reviewed documented beta blocker date and time   History of Anesthesia Complications (+) PONV and history of anesthetic complications  Airway Mallampati: II  TM Distance: >3 FB Neck ROM: full    Dental no notable dental hx. (+) Teeth Intact, Dental Advisory Given   Pulmonary COPD, Current Smoker,  10 pack year history   Pulmonary exam normal breath sounds clear to auscultation       Cardiovascular Exercise Tolerance: Good negative cardio ROS   Rhythm:regular Rate:Normal     Neuro/Psych negative neurological ROS  negative psych ROS   GI/Hepatic negative GI ROS, (+)     substance abuse  alcohol use,   Endo/Other  negative endocrine ROS  Renal/GU negative Renal ROS  Female GU complaint Ovarian mass, pelvic pain    Musculoskeletal Scoliosis    Abdominal Normal abdominal exam  (+)   Peds  Hematology negative hematology ROS (+)   Anesthesia Other Findings   Reproductive/Obstetrics negative OB ROS                             Anesthesia Physical Anesthesia Plan  ASA: III  Anesthesia Plan: General   Post-op Pain Management:    Induction: Intravenous  PONV Risk Score and Plan: 3 and Ondansetron, Dexamethasone, Midazolam, Treatment may vary due to age or medical condition, Scopolamine patch - Pre-op and Diphenhydramine  Airway Management Planned: Oral ETT  Additional Equipment: None  Intra-op Plan:   Post-operative Plan: Extubation in OR  Informed Consent: I have reviewed the patients History and Physical, chart, labs and discussed the procedure including the risks, benefits and alternatives for the proposed anesthesia with the patient or authorized representative who has indicated  his/her understanding and acceptance.     Dental advisory given  Plan Discussed with: CRNA  Anesthesia Plan Comments: (D/w patient possibility of additional PIVs, blood transfusion, arterial line. )       Anesthesia Quick Evaluation

## 2020-01-23 NOTE — Transfer of Care (Signed)
Immediate Anesthesia Transfer of Care Note  Patient: Alyssa Love  Procedure(s) Performed: Procedure(s) (LRB): TOTAL LAPAROSCOPIC HYSTERECTOMY WITH BILATERAL SALPINGOOOPHORECTOMY, RESECTION OF PELVIC MASS (Bilateral) CYSTOSCOPY (N/A)  Patient Location: PACU  Anesthesia Type: General  Level of Consciousness: awake, alert  and oriented  Airway & Oxygen Therapy: Patient Spontanous Breathing and Patient connected to nasal cannula oxygen  Post-op Assessment: Report given to PACU RN and Post -op Vital signs reviewed and stable  Post vital signs: Reviewed and stable  Complications: No apparent anesthesia complicationsLast Vitals:  Vitals Value Taken Time  BP    Temp    Pulse 95 01/23/20 1033  Resp    SpO2 99 % 01/23/20 1033  Vitals shown include unvalidated device data.  Last Pain:  Vitals:   01/23/20 0709  TempSrc: Oral  PainSc: 0-No pain      Patients Stated Pain Goal: 5 ([id number])  Complications:

## 2020-01-23 NOTE — Progress Notes (Signed)
Day of Surgery Procedure(s) (LRB): TOTAL LAPAROSCOPIC HYSTERECTOMY WITH BILATERAL SALPINGOOOPHORECTOMY, RESECTION OF PELVIC MASS (Bilateral) CYSTOSCOPY (N/A)  Subjective: Patient reports tolerating PO.  No nausea and pain well-controlled  Objective: I have reviewed patient's vital signs and intake and output.  General: alert and cooperative GI: incision: dry and intact Vaginal Bleeding: minimal  Assessment: s/p Procedure(s): TOTAL LAPAROSCOPIC HYSTERECTOMY WITH BILATERAL SALPINGOOOPHORECTOMY, RESECTION OF PELVIC MASS (Bilateral) CYSTOSCOPY (N/A): progressing well  Plan: Encourage ambulation Discontinue IV fluids later this PM D/c foley this PM   LOS: 0 days    Logan Bores 01/23/2020, 5:23 PM

## 2020-01-23 NOTE — Anesthesia Procedure Notes (Addendum)
Procedure Name: Intubation Date/Time: 01/23/2020 8:35 AM Performed by: Mechele Claude, CRNA Pre-anesthesia Checklist: Patient identified, Emergency Drugs available, Suction available and Patient being monitored Patient Re-evaluated:Patient Re-evaluated prior to induction Oxygen Delivery Method: Circle system utilized Preoxygenation: Pre-oxygenation with 100% oxygen Induction Type: IV induction Ventilation: Mask ventilation without difficulty Laryngoscope Size: Mac and 3 Grade View: Grade II Tube type: Oral Tube size: 7.0 mm Number of attempts: 1 Airway Equipment and Method: Stylet Placement Confirmation: ETT inserted through vocal cords under direct vision,  positive ETCO2 and breath sounds checked- equal and bilateral Secured at: 21 (at lip) cm Tube secured with: Tape Dental Injury: Teeth and Oropharynx as per pre-operative assessment

## 2020-01-23 NOTE — Progress Notes (Signed)
Patient ID: Alyssa Love, female   DOB: 1950-10-27, 69 y.o.   MRN: [id number] Per pt, no changes in dictated H&P.  Brief exam WNL.  Ready to proceed

## 2020-01-23 NOTE — Anesthesia Postprocedure Evaluation (Signed)
Anesthesia Post Note  Patient: EWA KOT  Procedure(s) Performed: TOTAL LAPAROSCOPIC HYSTERECTOMY WITH BILATERAL SALPINGOOOPHORECTOMY, RESECTION OF PELVIC MASS (Bilateral Abdomen) CYSTOSCOPY (N/A Ureter)     Patient location during evaluation: PACU Anesthesia Type: General Level of consciousness: awake and alert, oriented and patient cooperative Pain management: pain level controlled Vital Signs Assessment: post-procedure vital signs reviewed and stable Respiratory status: spontaneous breathing, nonlabored ventilation and respiratory function stable Cardiovascular status: blood pressure returned to baseline and stable Postop Assessment: no apparent nausea or vomiting Anesthetic complications: no    Last Vitals:  Vitals:   01/23/20 0709 01/23/20 1033  BP: (!) 146/72 123/65  Pulse: 97 95  Resp: 14 17  Temp: 36.7 C (!) 36.3 C  SpO2: 98% 99%    Last Pain:  Vitals:   01/23/20 0709  TempSrc: Oral  PainSc: 0-No pain                 Pervis Hocking

## 2020-01-24 DIAGNOSIS — N83511 Torsion of right ovary and ovarian pedicle: Secondary | ICD-10-CM | POA: Diagnosis not present

## 2020-01-24 DIAGNOSIS — M419 Scoliosis, unspecified: Secondary | ICD-10-CM | POA: Diagnosis not present

## 2020-01-24 DIAGNOSIS — Z7982 Long term (current) use of aspirin: Secondary | ICD-10-CM | POA: Diagnosis not present

## 2020-01-24 DIAGNOSIS — Z885 Allergy status to narcotic agent status: Secondary | ICD-10-CM | POA: Diagnosis not present

## 2020-01-24 DIAGNOSIS — N870 Mild cervical dysplasia: Secondary | ICD-10-CM | POA: Diagnosis not present

## 2020-01-24 LAB — CBC
HCT: 36.2 % (ref 36.0–46.0)
Hemoglobin: 11.9 g/dL — ABNORMAL LOW (ref 12.0–15.0)
MCH: 30.3 pg (ref 26.0–34.0)
MCHC: 32.9 g/dL (ref 30.0–36.0)
MCV: 92.1 fL (ref 80.0–100.0)
Platelets: 191 10*3/uL (ref 150–400)
RBC: 3.93 MIL/uL (ref 3.87–5.11)
RDW: 14.5 % (ref 11.5–15.5)
WBC: 19.5 10*3/uL — ABNORMAL HIGH (ref 4.0–10.5)
nRBC: 0 % (ref 0.0–0.2)

## 2020-01-24 LAB — SURGICAL PATHOLOGY

## 2020-01-24 MED ORDER — OXYCODONE HCL 5 MG PO TABS
ORAL_TABLET | ORAL | Status: AC
  Filled 2020-01-24: qty 1

## 2020-01-24 MED ORDER — ACETAMINOPHEN 325 MG PO TABS: 650.0000 mg | ORAL_TABLET | ORAL | 1 refills | Status: DC | PRN

## 2020-01-24 MED ORDER — OXYCODONE HCL 5 MG PO TABS: 5.0000 mg | ORAL_TABLET | ORAL | 0 refills | Status: DC | PRN

## 2020-01-24 MED ORDER — IBUPROFEN 800 MG PO TABS
ORAL_TABLET | ORAL | Status: AC
  Filled 2020-01-24: qty 1

## 2020-01-24 MED ORDER — IBUPROFEN 800 MG PO TABS: 800.0000 mg | ORAL_TABLET | Freq: Three times a day (TID) | ORAL | 0 refills | Status: DC

## 2020-01-24 NOTE — Progress Notes (Signed)
1 Day Post-Op Procedure(s) (LRB): TOTAL LAPAROSCOPIC HYSTERECTOMY WITH BILATERAL SALPINGOOOPHORECTOMY, RESECTION OF PELVIC MASS (Bilateral) CYSTOSCOPY (N/A)  Subjective: Patient reports tolerating PO and no problems voiding. She has not had a BM but feels rumbling.  Pain controlled with po meds  Objective: I have reviewed patient's vital signs, intake and output and labs.  General: alert and cooperative GI: soft NT, incisions intact, just some bruising Vaginal Bleeding: minimal  Assessment: s/p Procedure(s): TOTAL LAPAROSCOPIC HYSTERECTOMY WITH BILATERAL SALPINGOOOPHORECTOMY, RESECTION OF PELVIC MASS (Bilateral) CYSTOSCOPY (N/A): progressing well  Plan: Discharge home    LOS: 0 days    Logan Bores 01/24/2020, 8:25 AM

## 2020-01-24 NOTE — Discharge Instructions (Signed)

## 2020-11-07 DIAGNOSIS — Z1231 Encounter for screening mammogram for malignant neoplasm of breast: Secondary | ICD-10-CM | POA: Diagnosis not present

## 2020-12-05 ENCOUNTER — Encounter: Payer: Medicare Other | Admitting: Family Medicine

## 2020-12-05 DIAGNOSIS — Z0289 Encounter for other administrative examinations: Secondary | ICD-10-CM

## 2021-01-23 ENCOUNTER — Telehealth: Payer: Self-pay | Admitting: Family Medicine

## 2021-01-23 ENCOUNTER — Encounter: Payer: Self-pay | Admitting: Family Medicine

## 2021-01-23 ENCOUNTER — Ambulatory Visit (INDEPENDENT_AMBULATORY_CARE_PROVIDER_SITE_OTHER): Payer: Medicare Other | Admitting: Family Medicine

## 2021-01-23 ENCOUNTER — Other Ambulatory Visit: Payer: Self-pay

## 2021-01-23 VITALS — BP 126/74 | HR 84 | Temp 97.6°F | Resp 15 | Ht 60.0 in | Wt 141.8 lb

## 2021-01-23 DIAGNOSIS — E782 Mixed hyperlipidemia: Secondary | ICD-10-CM | POA: Diagnosis not present

## 2021-01-23 DIAGNOSIS — Z0000 Encounter for general adult medical examination without abnormal findings: Secondary | ICD-10-CM | POA: Diagnosis not present

## 2021-01-23 DIAGNOSIS — Z716 Tobacco abuse counseling: Secondary | ICD-10-CM | POA: Diagnosis not present

## 2021-01-23 LAB — LIPID PANEL
Cholesterol: 221 mg/dL — ABNORMAL HIGH (ref 0–200)
HDL: 59.7 mg/dL (ref >39.00)
LDL Cholesterol: 140 mg/dL — ABNORMAL HIGH (ref 0–99)
NonHDL: 161.27
Total CHOL/HDL Ratio: 4
Triglycerides: 108 mg/dL (ref 0.0–149.0)
VLDL: 21.6 mg/dL (ref 0.0–40.0)

## 2021-01-23 LAB — COMPREHENSIVE METABOLIC PANEL
ALT: 20 U/L (ref 0–35)
AST: 23 U/L (ref 0–37)
Albumin: 4 g/dL (ref 3.5–5.2)
Alkaline Phosphatase: 96 U/L (ref 39–117)
BUN: 14 mg/dL (ref 6–23)
CO2: 27 mEq/L (ref 19–32)
Calcium: 9.4 mg/dL (ref 8.4–10.5)
Chloride: 104 mEq/L (ref 96–112)
Creatinine, Ser: 0.64 mg/dL (ref 0.40–1.20)
GFR: 89.28 mL/min (ref >60.00)
Glucose, Bld: 85 mg/dL (ref 70–99)
Potassium: 4 mEq/L (ref 3.5–5.1)
Sodium: 139 mEq/L (ref 135–145)
Total Bilirubin: 0.4 mg/dL (ref 0.2–1.2)
Total Protein: 7.2 g/dL (ref 6.0–8.3)

## 2021-01-23 LAB — CBC WITH DIFFERENTIAL/PLATELET
Basophils Absolute: 0.1 10*3/uL (ref 0.0–0.1)
Basophils Relative: 0.8 % (ref 0.0–3.0)
Eosinophils Absolute: 0.1 10*3/uL (ref 0.0–0.7)
Eosinophils Relative: 1.7 % (ref 0.0–5.0)
HCT: 38.8 % (ref 36.0–46.0)
Hemoglobin: 12.9 g/dL (ref 12.0–15.0)
Lymphocytes Relative: 25.4 % (ref 12.0–46.0)
Lymphs Abs: 1.6 10*3/uL (ref 0.7–4.0)
MCHC: 33.1 g/dL (ref 30.0–36.0)
MCV: 88.2 fl (ref 78.0–100.0)
Monocytes Absolute: 0.8 10*3/uL (ref 0.1–1.0)
Monocytes Relative: 13.4 % — ABNORMAL HIGH (ref 3.0–12.0)
Neutro Abs: 3.6 10*3/uL (ref 1.4–7.7)
Neutrophils Relative %: 58.7 % (ref 43.0–77.0)
Platelets: 239 10*3/uL (ref 150.0–400.0)
RBC: 4.4 Mil/uL (ref 3.87–5.11)
RDW: 15.1 % (ref 11.5–15.5)
WBC: 6.2 10*3/uL (ref 4.0–10.5)

## 2021-01-23 LAB — VITAMIN D 25 HYDROXY (VIT D DEFICIENCY, FRACTURES): VITD: 107.09 ng/mL (ref 30.00–100.00)

## 2021-01-23 NOTE — Telephone Encounter (Signed)
Pt called back, asked her if she is taking VIt D 10,000 units daily? Pt said yes. Told pt received lab result back and she needs to stop Vit D supplement and follow up with Dr. Rogers Blocker to discuss and have lab repeated. Pt verbalized understanding.

## 2021-01-23 NOTE — Progress Notes (Signed)
.  Phone: [phone]  Subjective:  Patient presents today for their Medicare Exam and follow up of chronic medical issues including hyperlipidemia.   Preventive Screening-Counseling & Management  FH of breast cancer in her mother diagnosed in late 14's. No first degree relative with colon cancer.   Colonoscopy: 04/2019. Repeat 5 years.  Mammogram: 11/2020, done(not in record)  Pap smear: CIN 1 cervix 2021: s/p hysterectomy 01/23/2020. Surgical path: low grade squamous intraepithelial lesion. mucinous cystadenoma.   Advanced directives: yes and has will  Smoking Status: Smoker   Risk Factors Regular exercise: yes Diet: healthy, well rounded  Fall Risk: None  Fall Risk  01/23/2021 12/27/2019 12/05/2019 11/13/2018 11/13/2018  Falls in the past year? 0 0 0 0 0  Number falls in past yr: - 0 - 0 0  Injury with Fall? - 0 - 0 0  Follow up Falls evaluation completed Falls evaluation completed;Education provided;Falls prevention discussed - - -   Opioid use history:  no long term opioids use  Cardiac risk factors:  advanced age (older than 80 for men, 74 for women) yes Hyperlipidemia yes No diabetes. no Family History: paternal CVA   Depression Screen None. PHQ2 0  Depression screen [facility] 2/9 01/23/2021 12/27/2019 12/05/2019 11/13/2018  Decreased Interest 0 0 0 0  Down, Depressed, Hopeless 0 0 0 0  PHQ - 2 Score 0 0 0 0  Altered sleeping - - 0 0  Tired, decreased energy - - 0 0  Change in appetite - - 0 0  Feeling bad or failure about yourself  - - 0 0  Trouble concentrating - - 0 0  Moving slowly or fidgety/restless - - 0 0  Suicidal thoughts - - 0 0  PHQ-9 Score - - 0 0  Difficult doing work/chores - - Not difficult at all Not difficult at all    Activities of Daily Living Independent ADLs and IADLs   Hearing Difficulties: -patient declines  Cognitive Testing No reported trouble.    Normal 3 word recall 5/5 on mini-cog   List the Names of Other  Physician/Practitioners you currently use: -Dr. Fuller Plan: GI -Dr. Marvel Plan: GYN -[facility]   Immunization History  Administered Date(s) Administered  . Influenza Split 07/18/2013  . Influenza Whole 06/20/2012, 07/04/2019  . Influenza,inj,Quad PF,6+ Mos 06/29/2018  . Pneumococcal Polysaccharide-23 08/01/2003, 05/15/2010  . Td 08/01/2003  . Tdap 06/27/2012   Required Immunizations needed today   Screening tests- up to date There are no preventive care reminders to display for this patient.  ROS- No pertinent positives discovered in course of AWV  The following were reviewed and entered/updated in epic: Past Medical History:  Diagnosis Date  . Allergy    very mild- pollen   . Blood transfusion without reported diagnosis    35 yrs ago   . Cataract    removed right  age 39, left 53   . Chicken pox   . Colon polyp   . Complication of anesthesia   . Hyperlipidemia   . Left foot pain   . PONV (postoperative nausea and vomiting)   . Scoliosis   . Smoker    Patient Active Problem List   Diagnosis Date Noted  . S/P laparoscopic hysterectomy 01/23/2020  . Strabismus 12/05/2019  . Hyperlipidemia 05/13/2010  . SCOLIOSIS 05/13/2010   Past Surgical History:  Procedure Laterality Date  . CATARACT EXTRACTION, BILATERAL     age 52 and 42   . CHOLECYSTECTOMY  11-27-53  . COLONOSCOPY    .  CYST EXCISION Right 08/25/2018   Procedure: EXCISE CONJUNCTIVAL CYST;  Surgeon: Everitt Amber, MD;  Location: [place];  Service: Ophthalmology;  Laterality: Right;  . CYSTOSCOPY N/A 01/23/2020   Procedure: CYSTOSCOPY;  Surgeon: Paula Compton, MD;  Location: [facility];  Service: Gynecology;  Laterality: N/A;  . dental implant    . EYE SURGERY    . POLYPECTOMY    . STRABISMUS SURGERY Left 08/25/2018   Procedure: REPAIR STRABISMUS LEFT EYE;  Surgeon: Everitt Amber, MD;  Location: [place];  Service: Ophthalmology;  Laterality:  Left;- revision 2020  . TOTAL LAPAROSCOPIC HYSTERECTOMY WITH SALPINGECTOMY Bilateral 01/23/2020   Procedure: TOTAL LAPAROSCOPIC HYSTERECTOMY WITH BILATERAL SALPINGOOOPHORECTOMY, RESECTION OF PELVIC MASS;  Surgeon: Paula Compton, MD;  Location: [place];  Service: Gynecology;  Laterality: Bilateral;  . WISDOM TOOTH EXTRACTION      Family History  Problem Relation Age of Onset  . Stroke Father 34  . Prostate cancer Father   . Hypertension Father   . Lung cancer Mother   . Colon polyps Mother   . Cancer Maternal Grandmother   . Colon cancer Maternal Grandmother   . Cancer Paternal Grandmother   . Heart attack Paternal Grandfather   . Esophageal cancer Neg Hx   . Rectal cancer Neg Hx   . Stomach cancer Neg Hx     Medications- reviewed and updated Current Outpatient Medications  Medication Sig Dispense Refill  . Acetylcysteine (NAC PO) Take 3,000 mg by mouth daily.    . Ascorbic Acid (VITAMIN C) 1000 MG tablet Take 1,000 mg by mouth in the morning and at bedtime.     Marland Kitchen aspirin EC 81 MG tablet Take 81 mg by mouth at bedtime.     Marland Kitchen b complex vitamins tablet Take 1 tablet by mouth daily.    . Bacillus Coagulans-Inulin (PROBIOTIC-PREBIOTIC PO) Take 1 tablet by mouth daily.    . Cholecalciferol (VITAMIN D) 125 MCG (5000 UT) CAPS Take 10,000 Units by mouth daily.     . COLLAGEN PO Take 3,300 mg by mouth daily. Complex    . ELDERBERRY PO Take 100 mg by mouth daily. Gummies    . Magnesium 500 MG CAPS Take 500 mg by mouth daily.     . Omega-3 Fatty Acids (FISH OIL) 1200 MG CAPS Take 1,200 mg by mouth daily.     . TURMERIC PO Take 1,500 mg by mouth daily.    . vitamin A 3 MG (10000 UNITS) capsule Take 10,000 Units by mouth daily.    . vitamin E 180 MG (400 UNITS) capsule Take 400 Units by mouth daily.    . Zinc 50 MG TABS Take 50 mg by mouth daily.      No current facility-administered medications for this visit.    Allergies-reviewed and updated Allergies  Allergen  Reactions  . Codeine Other (See Comments)    Horrible dreams and hallucinations- She has had codeine cough meds with NO issues   . Hydrocodone-Acetaminophen     REACTION: hallucinations    Social History   Socioeconomic History  . Marital status: Divorced    Spouse name: Not on file  . Number of children: Not on file  . Years of education: Not on file  . Highest education level: Not on file  Occupational History    Employer: [employer]  Tobacco Use  . Smoking status: Current Every Day Smoker    Packs/day: 0.50    Years: 15.00  Pack years: 7.50    Types: Cigarettes    Start date: 03/24/1968  . Smokeless tobacco: Never Used  Vaping Use  . Vaping Use: Never used  Substance and Sexual Activity  . Alcohol use: Yes    Comment: occasinally maybe once a month  . Drug use: No  . Sexual activity: Not on file  Other Topics Concern  . Not on file  Social History Narrative   Enjoys gardening, walking, and cooking    Social Determinants of Health   Financial Resource Strain: Not on file  Food Insecurity: Not on file  Transportation Needs: Not on file  Physical Activity: Not on file  Stress: Not on file  Social Connections: Not on file    Objective: BP 126/74   Pulse 84   Temp 97.6 F (36.4 C) (Temporal)   Resp 15   Ht 5' (1.524 m)   Wt 141 lb 12.8 oz (64.3 kg)   SpO2 96%   BMI 27.69 kg/m  Gen: NAD, resting comfortably. Tobacco odor.  HEENT: Mucous membranes are moist. Oropharynx normal Neck: no thyromegaly CV: RRR no murmurs rubs or gallops Lungs: CTAB no crackles, wheeze, rhonchi Abdomen: soft/nontender/nondistended/normal bowel sounds. No rebound or guarding.  Ext: no edema Skin: warm, dry Neuro: grossly normal, moves all extremities, PERRLA  Assessment/Plan:  Annual  Medicare exam completed- discussed recommended screenings anddocumented any personalized health advice and referrals for preventive counseling. See AVS as well which was given to patient.    Status of chronic or acute concerns  Hyperlipidemia: checking fasting labs today. She was against statin drugs at last lab draw, but will see her risk and discuss again since still smoking.  The 10-year ASCVD risk score Mikey Bussing DC Jr., et al., 2013) is: 15%  Tobacco abuse  Candidate for low dose CT screening. Not interested in quitting.    Future Appointments  Date Time Provider [place]  01/25/2022 10:00 AM Allward[place], Randa Evens, PA-C [facility]   Return in about 1 year (around 01/23/2022) for hyperlipidemia/AWV with alyssa.   Lab/Order associations: Medicare annual wellness visit, subsequent - Plan: VITAMIN D 25 Hydroxy (Vit-D Deficiency, Fractures)  Mixed hyperlipidemia - Plan: Comprehensive metabolic panel, CBC with Differential/Platelet, Lipid panel  Tobacco abuse counseling  No orders of the defined types were placed in this encounter.   Return precautions advised. Orma Flaming, MD

## 2021-01-23 NOTE — Telephone Encounter (Signed)
CRITICAL VALUE STICKER for critical Vitamin D  CRITICAL VALUE: vitamin d is 96  RECEIVER (on-site recipient of call):Nelwyn Hebdon [place] NOTIFIED: 14:53  MESSENGER (representative from lab):  MD NOTIFIED: Orma Flaming  TIME OF NOTIFICATION:[id number]  RESPONSE:  NA

## 2021-01-23 NOTE — Telephone Encounter (Signed)
Please see message and advise 

## 2021-01-23 NOTE — Telephone Encounter (Signed)
Left message on voicemail to call office.  

## 2021-01-23 NOTE — Telephone Encounter (Signed)
Looks like she is on 10,000 IU Vit D daily, please confirm with patient.  I recommend stopping this completely and follow-up with PCP.

## 2021-01-23 NOTE — Patient Instructions (Signed)
Health Maintenance  Topic Date Due  . COVID-19 Vaccine (1) 02/08/2021 (Originally 03/25/1956)  . PNA vac Low Risk Adult (1 of 2 - PCV13) 01/23/2022 (Originally 03/25/2015)  . INFLUENZA VACCINE  05/04/2021  . MAMMOGRAM  11/01/2021  . TETANUS/TDAP  06/27/2022  . COLONOSCOPY (Pts 45-36yr Insurance coverage will need to be confirmed)  04/04/2024  . DEXA SCAN  11/01/2024  . Hepatitis C Screening  Completed  . HPV VACCINES  Aged Out    -routine labs for cholesterol today. Doing great. Encourage smoking cessation!    Preventive Care 624Years and Older, Female Preventive care refers to lifestyle choices and visits with your health care provider that can promote health and wellness. This includes:  A yearly physical exam. This is also called an annual wellness visit.  Regular dental and eye exams.  Immunizations.  Screening for certain conditions.  Healthy lifestyle choices, such as: ? Eating a healthy diet. ? Getting regular exercise. ? Not using drugs or products that contain nicotine and tobacco. ? Limiting alcohol use. What can I expect for my preventive care visit? Physical exam Your health care provider will check your:  Height and weight. These may be used to calculate your BMI (body mass index). BMI is a measurement that tells if you are at a healthy weight.  Heart rate and blood pressure.  Body temperature.  Skin for abnormal spots. Counseling Your health care provider may ask you questions about your:  Past medical problems.  Family's medical history.  Alcohol, tobacco, and drug use.  Emotional well-being.  Home life and relationship well-being.  Sexual activity.  Diet, exercise, and sleep habits.  History of falls.  Memory and ability to understand (cognition).  Work and work eStatistician  Pregnancy and menstrual history.  Access to firearms. What immunizations do I need? Vaccines are usually given at various ages, according to a schedule. Your  health care provider will recommend vaccines for you based on your age, medical history, and lifestyle or other factors, such as travel or where you work.   What tests do I need? Blood tests  Lipid and cholesterol levels. These may be checked every 5 years, or more often depending on your overall health.  Hepatitis C test.  Hepatitis B test. Screening  Lung cancer screening. You may have this screening every year starting at age 6295if you have a 30-pack-year history of smoking and currently smoke or have quit within the past 15 years.  Colorectal cancer screening. ? All adults should have this screening starting at age 62962and continuing until age 71 ? Your health care provider may recommend screening at age 714if you are at increased risk. ? You will have tests every 1-10 years, depending on your results and the type of screening test.  Diabetes screening. ? This is done by checking your blood sugar (glucose) after you have not eaten for a while (fasting). ? You may have this done every 1-3 years.  Mammogram. ? This may be done every 1-2 years. ? Talk with your health care provider about how often you should have regular mammograms.  Abdominal aortic aneurysm (AAA) screening. You may need this if you are a current or former smoker.  BRCA-related cancer screening. This may be done if you have a family history of breast, ovarian, tubal, or peritoneal cancers. Other tests  STD (sexually transmitted disease) testing, if you are at risk.  Bone density scan. This is done to screen for osteoporosis. You may have  this done starting at age 52. Talk with your health care provider about your test results, treatment options, and if necessary, the need for more tests. Follow these instructions at home: Eating and drinking  Eat a diet that includes fresh fruits and vegetables, whole grains, lean protein, and low-fat dairy products. Limit your intake of foods with high amounts of sugar,  saturated fats, and salt.  Take vitamin and mineral supplements as recommended by your health care provider.  Do not drink alcohol if your health care provider tells you not to drink.  If you drink alcohol: ? Limit how much you have to 0-1 drink a day. ? Be aware of how much alcohol is in your drink. In the [place] one drink equals one 12 oz bottle of beer (355 mL), one 5 oz glass of wine (148 mL), or one 1 oz glass of hard liquor (44 mL).   Lifestyle  Take daily care of your teeth and gums. Brush your teeth every morning and night with fluoride toothpaste. Floss one time each day.  Stay active. Exercise for at least 30 minutes 5 or more days each week.  Do not use any products that contain nicotine or tobacco, such as cigarettes, e-cigarettes, and chewing tobacco. If you need help quitting, ask your health care provider.  Do not use drugs.  If you are sexually active, practice safe sex. Use a condom or other form of protection in order to prevent STIs (sexually transmitted infections).  Talk with your health care provider about taking a low-dose aspirin or statin.  Find healthy ways to cope with stress, such as: ? Meditation, yoga, or listening to music. ? Journaling. ? Talking to a trusted person. ? Spending time with friends and family. Safety  Always wear your seat belt while driving or riding in a vehicle.  Do not drive: ? If you have been drinking alcohol. Do not ride with someone who has been drinking. ? When you are tired or distracted. ? While texting.  Wear a helmet and other protective equipment during sports activities.  If you have firearms in your house, make sure you follow all gun safety procedures. What's next?  Visit your health care provider once a year for an annual wellness visit.  Ask your health care provider how often you should have your eyes and teeth checked.  Stay up to date on all vaccines. This information is not intended to replace advice  given to you by your health care provider. Make sure you discuss any questions you have with your health care provider. Document Revised: 09/10/2020 Document Reviewed: 09/14/2018 Elsevier Patient Education  2021 [employer].

## 2021-01-23 NOTE — Telephone Encounter (Signed)
I agree with Aldona Bar on this.  Team-please reach out to patient-either [place] or [place] team

## 2021-01-26 ENCOUNTER — Encounter: Payer: Self-pay | Admitting: Family Medicine

## 2021-01-26 DIAGNOSIS — Z720 Tobacco use: Secondary | ICD-10-CM | POA: Insufficient documentation

## 2021-01-26 NOTE — Telephone Encounter (Signed)
Called patient myself. See lab note. She has stopped vitamin D. Doesn't need lab repeat as this was from exogenous d.  Orma Flaming, MD [place]

## 2022-01-21 ENCOUNTER — Encounter: Payer: Self-pay | Admitting: Physician Assistant

## 2022-01-21 ENCOUNTER — Ambulatory Visit (INDEPENDENT_AMBULATORY_CARE_PROVIDER_SITE_OTHER): Payer: Medicare Other | Admitting: Physician Assistant

## 2022-01-21 VITALS — BP 156/80 | HR 82 | Temp 97.9°F | Ht 60.0 in | Wt 140.4 lb

## 2022-01-21 DIAGNOSIS — E782 Mixed hyperlipidemia: Secondary | ICD-10-CM

## 2022-01-21 DIAGNOSIS — Z0000 Encounter for general adult medical examination without abnormal findings: Secondary | ICD-10-CM

## 2022-01-21 DIAGNOSIS — E559 Vitamin D deficiency, unspecified: Secondary | ICD-10-CM

## 2022-01-21 DIAGNOSIS — Z720 Tobacco use: Secondary | ICD-10-CM

## 2022-01-21 LAB — CBC WITH DIFFERENTIAL/PLATELET
Basophils Absolute: 0.1 10*3/uL (ref 0.0–0.1)
Basophils Relative: 0.8 % (ref 0.0–3.0)
Eosinophils Absolute: 0.1 10*3/uL (ref 0.0–0.7)
Eosinophils Relative: 1.3 % (ref 0.0–5.0)
HCT: 35.3 % — ABNORMAL LOW (ref 36.0–46.0)
Hemoglobin: 11.5 g/dL — ABNORMAL LOW (ref 12.0–15.0)
Lymphocytes Relative: 26.4 % (ref 12.0–46.0)
Lymphs Abs: 1.9 10*3/uL (ref 0.7–4.0)
MCHC: 32.5 g/dL (ref 30.0–36.0)
MCV: 81.4 fl (ref 78.0–100.0)
Monocytes Absolute: 0.7 10*3/uL (ref 0.1–1.0)
Monocytes Relative: 10.1 % (ref 3.0–12.0)
Neutro Abs: 4.5 10*3/uL (ref 1.4–7.7)
Neutrophils Relative %: 61.4 % (ref 43.0–77.0)
Platelets: 265 10*3/uL (ref 150.0–400.0)
RBC: 4.34 Mil/uL (ref 3.87–5.11)
RDW: 17.1 % — ABNORMAL HIGH (ref 11.5–15.5)
WBC: 7.4 10*3/uL (ref 4.0–10.5)

## 2022-01-21 LAB — COMPREHENSIVE METABOLIC PANEL
ALT: 23 U/L (ref 0–35)
AST: 25 U/L (ref 0–37)
Albumin: 4.1 g/dL (ref 3.5–5.2)
Alkaline Phosphatase: 88 U/L (ref 39–117)
BUN: 11 mg/dL (ref 6–23)
CO2: 27 mEq/L (ref 19–32)
Calcium: 9.2 mg/dL (ref 8.4–10.5)
Chloride: 103 mEq/L (ref 96–112)
Creatinine, Ser: 0.66 mg/dL (ref 0.40–1.20)
GFR: 88 mL/min (ref >60.00)
Glucose, Bld: 89 mg/dL (ref 70–99)
Potassium: 3.8 mEq/L (ref 3.5–5.1)
Sodium: 138 mEq/L (ref 135–145)
Total Bilirubin: 0.4 mg/dL (ref 0.2–1.2)
Total Protein: 7.3 g/dL (ref 6.0–8.3)

## 2022-01-21 LAB — LIPID PANEL
Cholesterol: 231 mg/dL — ABNORMAL HIGH (ref 0–200)
HDL: 63.4 mg/dL (ref >39.00)
LDL Cholesterol: 143 mg/dL — ABNORMAL HIGH (ref 0–99)
NonHDL: 168.01
Total CHOL/HDL Ratio: 4
Triglycerides: 123 mg/dL (ref 0.0–149.0)
VLDL: 24.6 mg/dL (ref 0.0–40.0)

## 2022-01-21 LAB — VITAMIN D 25 HYDROXY (VIT D DEFICIENCY, FRACTURES): VITD: 78.7 ng/mL (ref 30.00–100.00)

## 2022-01-21 NOTE — Patient Instructions (Addendum)
*  Please try to find info to update our vaccine records for you from your office  ? ?Good to meet you today! ?Please go to the lab for blood work and I will send results through [facility]. ? ?Consider cessation of smoking.  ?

## 2022-01-21 NOTE — Progress Notes (Signed)
? ?Subjective:  ? ? Patient ID: Alyssa Love, female    DOB: 04-Apr-1951, 71 y.o.   MRN: [id number] ? ?Chief Complaint  ?Patient presents with  ? Transitions Of Care  ? Hyperlipidemia  ? Annual Exam  ?  No concerns, pt is fasting.   ? ? ?71 y.o. patient presents today for new patient establishment with me.  Patient was previously established with Dr. Rogers Blocker. One son, also has a three year old granddaughter - they live in [place], [place].  ? ?Takes one Ivermectin weekly for COVID-19 prevention. Has not had COVID-19 herself.  ? ?Currently taking Vit D 5000 iu daily ? ?Health maintenance: ?Lifestyle/ exercise: Walks daily and gardens. Plans to [facility] [employer] [facility]. ?Nutrition: Fruits and vegetables; occ meat; eats mostly at home  ?Mental health: No concerns, still working 40 hours / week  ?Caffeine: 1.5 cup coffee usually daily  ?Sleep: No concerns, very rare RLS - wears socks to be and it helps ?Substance use: 1/2 pack smoking per day, only quit while pregnant  ?Immunizations: States she had Shingles and Pneumonia vaccines done ?Colonoscopy: UTD - Dr. Fuller Plan  ?Mammogram: UTD - Done with Dr. Marvel Plan ?DEXA: UTD  ? ?Current Care Team: ?No specialists  ? ? ?Past Medical History:  ?Diagnosis Date  ? Allergy   ? very mild- pollen   ? Blood transfusion without reported diagnosis   ? 35 yrs ago   ? Cataract   ? removed right  age 40, left 28   ? Chicken pox   ? Colon polyp   ? Complication of anesthesia   ? Hyperlipidemia   ? Left foot pain   ? PONV (postoperative nausea and vomiting)   ? Scoliosis   ? Smoker   ? ? ?Past Surgical History:  ?Procedure Laterality Date  ? CATARACT EXTRACTION, BILATERAL    ? age 59 and 78   ? CHOLECYSTECTOMY  11-27-53  ? COLONOSCOPY    ? CYST EXCISION Right 08/25/2018  ? Procedure: EXCISE CONJUNCTIVAL CYST;  Surgeon: Everitt Amber, MD;  Location: [facility];  Service: Ophthalmology;  Laterality: Right;  ? CYSTOSCOPY N/A 01/23/2020  ? Procedure: CYSTOSCOPY;  Surgeon: Paula Compton, MD;  Location: [facility];  Service: Gynecology;  Laterality: N/A;  ? dental implant    ? EYE SURGERY    ? POLYPECTOMY    ? STRABISMUS SURGERY Left 08/25/2018  ? Procedure: REPAIR STRABISMUS LEFT EYE;  Surgeon: Everitt Amber, MD;  Location: [facility];  Service: Ophthalmology;  Laterality: Left;- revision 2020  ? TOTAL LAPAROSCOPIC HYSTERECTOMY WITH SALPINGECTOMY Bilateral 01/23/2020  ? Procedure: TOTAL LAPAROSCOPIC HYSTERECTOMY WITH BILATERAL SALPINGOOOPHORECTOMY, RESECTION OF PELVIC MASS;  Surgeon: Paula Compton, MD;  Location: [facility];  Service: Gynecology;  Laterality: Bilateral;  ? WISDOM TOOTH EXTRACTION    ? ? ?Family History  ?Problem Relation Age of Onset  ? Stroke Father 61  ? Prostate cancer Father   ? Hypertension Father   ? Lung cancer Mother   ? Colon polyps Mother   ? Cancer Maternal Grandmother   ? Colon cancer Maternal Grandmother   ? Cancer Paternal Grandmother   ? Heart attack Paternal Grandfather   ? Esophageal cancer Neg Hx   ? Rectal cancer Neg Hx   ? Stomach cancer Neg Hx   ? ? ?Social History  ? ?Tobacco Use  ? Smoking status: Every Day  ?  Packs/day: 0.50  ?  Years: 15.00  ?  Pack years: 7.50  ?  Types: Cigarettes  ?  Start date: 03/24/1968  ? Smokeless tobacco: Never  ?Vaping Use  ? Vaping Use: Never used  ?Substance Use Topics  ? Alcohol use: Yes  ?  Comment: occasinally maybe once a month  ? Drug use: No  ?  ? ?Allergies  ?Allergen Reactions  ? Codeine Other (See Comments)  ?  Horrible dreams and hallucinations- ?She has had codeine cough meds with NO issues   ? Hydrocodone-Acetaminophen   ?  REACTION: hallucinations  ? ? ?Review of Systems ?NEGATIVE UNLESS OTHERWISE INDICATED IN HPI ? ? ?   ?Objective:  ?  ? ?BP (!) 156/80   Pulse 82   Temp 97.9 ?F (36.6 ?C) (Temporal)   Ht 5' (1.524 m)   Wt 140 lb 6.4 oz (63.7 kg)   SpO2 96%   BMI 27.42 kg/m?  ? ?Wt Readings from Last 3 Encounters:  ?01/21/22 140 lb 6.4 oz (63.7 kg)   ?01/23/21 141 lb 12.8 oz (64.3 kg)  ?01/23/20 135 lb 14.4 oz (61.6 kg)  ? ? ?BP Readings from Last 3 Encounters:  ?01/21/22 (!) 156/80  ?01/23/21 126/74  ?01/24/20 122/62  ?  ? ?Physical Exam ?Vitals and nursing note reviewed.  ?Constitutional:   ?   Appearance: Normal appearance. She is normal weight. She is not toxic-appearing.  ?HENT:  ?   Head: Normocephalic and atraumatic.  ?   Right Ear: Tympanic membrane, ear canal and external ear normal.  ?   Left Ear: Tympanic membrane, ear canal and external ear normal.  ?   Nose: Nose normal.  ?   Mouth/Throat:  ?   Mouth: Mucous membranes are moist.  ?Eyes:  ?   Extraocular Movements: Extraocular movements intact.  ?   Conjunctiva/sclera: Conjunctivae normal.  ?   Pupils: Pupils are equal, round, and reactive to light.  ?Cardiovascular:  ?   Rate and Rhythm: Normal rate and regular rhythm.  ?   Pulses: Normal pulses.  ?   Heart sounds: Normal heart sounds.  ?Pulmonary:  ?   Effort: Pulmonary effort is normal.  ?   Breath sounds: Normal breath sounds.  ?Abdominal:  ?   General: Abdomen is flat. Bowel sounds are normal.  ?   Palpations: Abdomen is soft.  ?Musculoskeletal:     ?   General: Normal range of motion.  ?   Cervical back: Normal range of motion and neck supple.  ?Skin: ?   General: Skin is warm and dry.  ?Neurological:  ?   General: No focal deficit present.  ?   Mental Status: She is alert and oriented to person, place, and time.  ?Psychiatric:     ?   Mood and Affect: Mood normal.     ?   Behavior: Behavior normal.     ?   Thought Content: Thought content normal.     ?   Judgment: Judgment normal.  ? ? ?   ?Assessment & Plan:  ? ?Problem List Items Addressed This Visit   ? ?  ? Other  ? Hyperlipidemia  ? Relevant Orders  ? Lipid panel (Completed)  ? Tobacco abuse  ? Relevant Orders  ? CBC with Differential/Platelet (Completed)  ? Comprehensive metabolic panel (Completed)  ? Lipid panel (Completed)  ? Vitamin D (25 hydroxy) (Completed)  ? ?Other Visit  Diagnoses   ? ? Encounter for annual physical exam    -  Primary  ? Vitamin D deficiency      ?  Relevant Orders  ? Vitamin D (25 hydroxy) (Completed)  ? ?  ? ?1. Encounter for annual physical exam ?Age-appropriate screening and counseling performed today. Will check labs and call with results. Preventive measures discussed and printed in AVS for patient. She states she had Shingrix and Prevnar completed; she will get records to [place]. Encouraged her to keep up with healthy lifestyle goals. ? ?2. Mixed hyperlipidemia ?Hx of hyperlipidemia. She does not want to take medication for this. Fasting labs - recheck today. The 10-year ASCVD risk score (Arnett DK, et al., 2019) is: 23.6% Very concerning as pt is still smoking.  ? ?3. Vitamin D deficiency ?Recheck level today. Previous year her level was overly elevated 2/2 exogenous Vit D. ? ?4. Tobacco abuse ?1/2 ppd. No motivated to quit. ? ? ?F/up 1 year or prn ? ?Azaryah Oleksy M Toniqua Melamed, PA-C ?

## 2022-01-25 ENCOUNTER — Encounter: Payer: Medicare Other | Admitting: Physician Assistant

## 2022-03-12 ENCOUNTER — Telehealth: Payer: Self-pay | Admitting: Physician Assistant

## 2022-03-12 NOTE — Telephone Encounter (Signed)
Spoke with patient she declined AWV do not call  

## 2022-06-28 ENCOUNTER — Encounter: Payer: Self-pay | Admitting: *Deleted

## 2022-09-16 ENCOUNTER — Encounter: Payer: Self-pay | Admitting: *Deleted

## 2022-12-10 LAB — HM MAMMOGRAPHY

## 2023-01-07 ENCOUNTER — Ambulatory Visit: Payer: Medicare Other | Admitting: Physician Assistant

## 2023-01-11 ENCOUNTER — Ambulatory Visit: Payer: Medicare Other | Admitting: Physician Assistant

## 2023-01-13 ENCOUNTER — Ambulatory Visit: Payer: Medicare Other | Admitting: Physician Assistant

## 2023-01-14 ENCOUNTER — Ambulatory Visit (INDEPENDENT_AMBULATORY_CARE_PROVIDER_SITE_OTHER): Payer: Medicare Other | Admitting: Physician Assistant

## 2023-01-14 ENCOUNTER — Encounter: Payer: Self-pay | Admitting: Physician Assistant

## 2023-01-14 VITALS — BP 128/74 | HR 87 | Temp 97.7°F | Ht 60.0 in | Wt 119.0 lb

## 2023-01-14 DIAGNOSIS — R252 Cramp and spasm: Secondary | ICD-10-CM

## 2023-01-14 DIAGNOSIS — R5383 Other fatigue: Secondary | ICD-10-CM

## 2023-01-14 DIAGNOSIS — R634 Abnormal weight loss: Secondary | ICD-10-CM

## 2023-01-14 DIAGNOSIS — Z720 Tobacco use: Secondary | ICD-10-CM

## 2023-01-14 LAB — CBC WITH DIFFERENTIAL/PLATELET
Basophils Relative: 1 % (ref 0.0–3.0)
Eosinophils Relative: 1 % (ref 0.0–5.0)
HCT: 36.2 % (ref 36.0–46.0)
Hemoglobin: 11.2 g/dL — ABNORMAL LOW (ref 12.0–15.0)
Lymphocytes Relative: 24 % (ref 12.0–46.0)
MCHC: 30.9 g/dL (ref 30.0–36.0)
MCV: 70.8 fl — ABNORMAL LOW (ref 78.0–100.0)
Monocytes Relative: 4 % (ref 3.0–12.0)
Neutrophils Relative %: 60 % (ref 43.0–77.0)
Platelets: 343 10*3/uL (ref 150.0–400.0)
RBC: 5.11 Mil/uL (ref 3.87–5.11)
RDW: 39.7 % — ABNORMAL HIGH (ref 11.5–15.5)
WBC: 6.6 10*3/uL (ref 4.0–10.5)

## 2023-01-14 LAB — IBC + FERRITIN
Ferritin: 64.1 ng/mL (ref 10.0–291.0)
Iron: 455 ug/dL — ABNORMAL HIGH (ref 42–145)
Saturation Ratios: 95 % — ABNORMAL HIGH (ref 20.0–50.0)
TIBC: 478.8 ug/dL — ABNORMAL HIGH (ref 250.0–450.0)
Transferrin: 342 mg/dL (ref 212.0–360.0)

## 2023-01-14 LAB — COMPREHENSIVE METABOLIC PANEL
ALT: 21 U/L (ref 0–35)
AST: 23 U/L (ref 0–37)
Albumin: 4.4 g/dL (ref 3.5–5.2)
Alkaline Phosphatase: 108 U/L (ref 39–117)
BUN: 15 mg/dL (ref 6–23)
CO2: 25 mEq/L (ref 19–32)
Calcium: 9.8 mg/dL (ref 8.4–10.5)
Chloride: 104 mEq/L (ref 96–112)
Creatinine, Ser: 0.66 mg/dL (ref 0.40–1.20)
GFR: 87.4 mL/min (ref >60.00)
Glucose, Bld: 102 mg/dL — ABNORMAL HIGH (ref 70–99)
Potassium: 4.1 mEq/L (ref 3.5–5.1)
Sodium: 138 mEq/L (ref 135–145)
Total Bilirubin: 0.3 mg/dL (ref 0.2–1.2)
Total Protein: 7.1 g/dL (ref 6.0–8.3)

## 2023-01-14 LAB — MAGNESIUM: Magnesium: 2.2 mg/dL (ref 1.5–2.5)

## 2023-01-14 LAB — VITAMIN B12: Vitamin B-12: >1500 pg/mL — ABNORMAL HIGH (ref 211–911)

## 2023-01-14 NOTE — Progress Notes (Unsigned)
Subjective:    Patient ID: Alyssa Love, female    DOB: 1951-05-19, 72 y.o.   MRN: [id number]  Chief Complaint  Patient presents with   Anemia    Pt in the office to discuss possible anemia, pt wanting labs; doesn't have energy level as before, pt states she doesn't feel bad just not feeling great; feet tingles and pt has noticed; pt has started iron supplement and B-12. Pt appetite has also changed as well; pt has lost     HPI Patient is in today for possible anemia / wt loss concerns.   At first last year, intentionally trying to lose weight, then weight loss has just continued without really try. Per our scale, 21 lb lost in the last year. Now says she can't gain weight back.   Drinking muscle milk & trying to snack between meals. Taste buds also off.  Has been craving crushed ice in the last year.   Power naps in the afternoon and this helps her fatigue. Works from home the last 3 years.  No bleeding issues. No dizziness. Occ feels like 'just not with it' like before.  No complaints of pain. No bowel changes. No CP or SOB.   Some tingling feeling at night, but otherwise no other concerns.   Mammo and Colonoscopy UTD per pt.  Past Medical History:  Diagnosis Date   Allergy    very mild- pollen    Blood transfusion without reported diagnosis    35 yrs ago    Cataract    removed right  age 89, left 44    Chicken pox    Colon polyp    Complication of anesthesia    Hyperlipidemia    Left foot pain    PONV (postoperative nausea and vomiting)    Scoliosis    Smoker     Past Surgical History:  Procedure Laterality Date   CATARACT EXTRACTION, BILATERAL     age 11 and 76    CHOLECYSTECTOMY  11-27-53   COLONOSCOPY     CYST EXCISION Right 08/25/2018   Procedure: EXCISE CONJUNCTIVAL CYST;  Surgeon: Verne Carrow, MD;  Location: [facility];  Service: Ophthalmology;  Laterality: Right;   CYSTOSCOPY N/A 01/23/2020   Procedure: CYSTOSCOPY;  Surgeon:  Huel Cote, MD;  Location: [facility];  Service: Gynecology;  Laterality: N/A;   dental implant     EYE SURGERY     POLYPECTOMY     STRABISMUS SURGERY Left 08/25/2018   Procedure: REPAIR STRABISMUS LEFT EYE;  Surgeon: Verne Carrow, MD;  Location: [facility];  Service: Ophthalmology;  Laterality: Left;- revision 2020   TOTAL LAPAROSCOPIC HYSTERECTOMY WITH SALPINGECTOMY Bilateral 01/23/2020   Procedure: TOTAL LAPAROSCOPIC HYSTERECTOMY WITH BILATERAL SALPINGOOOPHORECTOMY, RESECTION OF PELVIC MASS;  Surgeon: Huel Cote, MD;  Location: [facility];  Service: Gynecology;  Laterality: Bilateral;   WISDOM TOOTH EXTRACTION      Family History  Problem Relation Age of Onset   Stroke Father 89   Prostate cancer Father    Hypertension Father    Lung cancer Mother    Colon polyps Mother    Cancer Maternal Grandmother    Colon cancer Maternal Grandmother    Cancer Paternal Grandmother    Heart attack Paternal Grandfather    Esophageal cancer Neg Hx    Rectal cancer Neg Hx    Stomach cancer Neg Hx     Social History   Tobacco Use   Smoking status:  Every Day    Packs/day: 0.50    Years: 15.00    Additional pack years: 0.00    Total pack years: 7.50    Types: Cigarettes    Start date: 03/24/1968   Smokeless tobacco: Never  Vaping Use   Vaping Use: Never used  Substance Use Topics   Alcohol use: Yes    Comment: occasinally maybe once a month   Drug use: No     Allergies  Allergen Reactions   Codeine Other (See Comments)    Horrible dreams and hallucinations- She has had codeine cough meds with NO issues    Hydrocodone-Acetaminophen     REACTION: hallucinations    Review of Systems NEGATIVE UNLESS OTHERWISE INDICATED IN HPI      Objective:     BP (!) 148/82 (BP Location: Left Arm)   Pulse 87   Temp 97.7 F (36.5 C) (Temporal)   Ht 5' (1.524 m)   Wt 119 lb (54 kg)   SpO2 96%   BMI 23.24 kg/m   Wt Readings  from Last 3 Encounters:  01/14/23 119 lb (54 kg)  01/21/22 140 lb 6.4 oz (63.7 kg)  01/23/21 141 lb 12.8 oz (64.3 kg)    BP Readings from Last 3 Encounters:  01/14/23 (!) 148/82  01/21/22 (!) 156/80  01/23/21 126/74     Physical Exam     Assessment & Plan:  There are no diagnoses linked to this encounter.      No follow-ups on file.  This note was prepared with assistance of Conservation officer, historic buildings. Occasional wrong-word or sound-a-like substitutions may have occurred due to the inherent limitations of voice recognition software.  Time Spent: *** minutes of total time was spent on the date of the encounter performing the following actions: chart review prior to seeing the patient, obtaining history, performing a medically necessary exam, counseling on the treatment plan, placing orders, and documenting in our EHR.       Mio Schellinger M Lakeena Downie, PA-C

## 2023-01-15 LAB — THYROID PANEL WITH TSH
Free Thyroxine Index: 2.2 (ref 1.4–3.8)
T3 Uptake: 29 % (ref 22–35)
T4, Total: 7.6 ug/dL (ref 5.1–11.9)
TSH: 2.11 mIU/L (ref 0.40–4.50)

## 2023-01-17 ENCOUNTER — Other Ambulatory Visit: Payer: Self-pay

## 2023-01-17 DIAGNOSIS — D509 Iron deficiency anemia, unspecified: Secondary | ICD-10-CM

## 2023-01-18 ENCOUNTER — Telehealth: Payer: Self-pay | Admitting: Physician Assistant

## 2023-01-18 NOTE — Telephone Encounter (Signed)
Please see pt call msg and advise, spoke with patient 01/18/23 and advised lab results, pt not happy she isn't getting call from provider personally to discuss lab results.

## 2023-01-18 NOTE — Telephone Encounter (Signed)
LMOVM advising patient to schedule visit with PCP to discuss labs

## 2023-01-18 NOTE — Telephone Encounter (Signed)
Pt picked up stool card. Would like a call to speak with Alyssa regarding her labs. Asking to please call home #.

## 2023-01-19 ENCOUNTER — Other Ambulatory Visit: Payer: Medicare Other

## 2023-01-20 ENCOUNTER — Other Ambulatory Visit (INDEPENDENT_AMBULATORY_CARE_PROVIDER_SITE_OTHER): Payer: Medicare Other

## 2023-01-20 DIAGNOSIS — D509 Iron deficiency anemia, unspecified: Secondary | ICD-10-CM

## 2023-01-20 LAB — FECAL OCCULT BLOOD, IMMUNOCHEMICAL: Fecal Occult Bld: NEGATIVE

## 2023-01-21 ENCOUNTER — Telehealth: Payer: Self-pay | Admitting: Physician Assistant

## 2023-01-21 ENCOUNTER — Encounter: Payer: Self-pay | Admitting: Physician Assistant

## 2023-01-21 ENCOUNTER — Telehealth (INDEPENDENT_AMBULATORY_CARE_PROVIDER_SITE_OTHER): Payer: Medicare Other | Admitting: Physician Assistant

## 2023-01-21 DIAGNOSIS — Z720 Tobacco use: Secondary | ICD-10-CM

## 2023-01-21 DIAGNOSIS — R5383 Other fatigue: Secondary | ICD-10-CM

## 2023-01-21 DIAGNOSIS — R634 Abnormal weight loss: Secondary | ICD-10-CM | POA: Diagnosis not present

## 2023-01-21 DIAGNOSIS — D509 Iron deficiency anemia, unspecified: Secondary | ICD-10-CM | POA: Diagnosis not present

## 2023-01-21 DIAGNOSIS — R252 Cramp and spasm: Secondary | ICD-10-CM

## 2023-01-21 NOTE — Progress Notes (Signed)
   Virtual Visit via Video Note  I connected with  Alyssa Love  on 01/23/23 at  8:30 AM EDT by a video enabled telemedicine application and verified that I am speaking with the correct person using two identifiers.  Location: Patient: home Provider: Nature conservation officer at [employer] Persons present: Patient and myself   I discussed the limitations of evaluation and management by telemedicine and the availability of in person appointments. The patient expressed understanding and agreed to proceed.   History of Present Illness:  72 yo female presenting for virtual visit to review recent labs; discuss updates.    Observations/Objective:   Gen: Awake, alert, no acute distress Resp: Breathing is even and non-labored Psych: calm/pleasant demeanor Neuro: Alert and Oriented x 3, + facial symmetry, speech is clear.   Assessment and Plan:  Problem List Items Addressed This Visit       Other   Tobacco abuse    Qualifies for low dose CT scan. Strongly encouraged; pt will consider, declines at this time.      Iron deficiency anemia    Stool card negative.  Will have her update labs prior to next appt. She will continue on iron supplement.  Plan to check UA for micro blood as well. (Consider bladder with smoking status).      Relevant Orders   CBC with Differential/Platelet   Iron, TIBC and Ferritin Panel   Urinalysis, Routine w reflex microscopic   Abnormal weight loss    Patient reports that she is gaining weight by starting to eat better; reports that in the last year she was working through some meals; starting to be more intentional since our visit.       Relevant Orders   CBC with Differential/Platelet   Iron, TIBC and Ferritin Panel   Urinalysis, Routine w reflex microscopic   Other fatigue   Relevant Orders   CBC with Differential/Platelet   Iron, TIBC and Ferritin Panel   Urinalysis, Routine w reflex microscopic   Leg cramps    Increasing  water. Continue iron supplement. Monitor at this time.       Relevant Orders   CBC with Differential/Platelet   Iron, TIBC and Ferritin Panel   Urinalysis, Routine w reflex microscopic    Follow Up Instructions:    I discussed the assessment and treatment plan with the patient. The patient was provided an opportunity to ask questions and all were answered. The patient agreed with the plan and demonstrated an understanding of the instructions.   The patient was advised to call back or seek an in-person evaluation if the symptoms worsen or if the condition fails to improve as anticipated.  Aniello Christopoulos M Shabre Kreher, PA-C

## 2023-01-21 NOTE — Telephone Encounter (Signed)
Patient stated PCP informed her to schedule a lab visit a couple of days prior to her 5/13 OV for lab work including urinalysis. Pt has been scheduled for 02/10/23 @ 2pm, please make sure labs are ordered prior.

## 2023-01-21 NOTE — Telephone Encounter (Signed)
Labs placed.

## 2023-01-21 NOTE — Telephone Encounter (Signed)
It looks like pt had labs drawn 01/14/23, does she need repeat labs prior to next visit? If so, please advise which labs should be checked or re-checked.

## 2023-01-23 DIAGNOSIS — R5383 Other fatigue: Secondary | ICD-10-CM | POA: Insufficient documentation

## 2023-01-23 DIAGNOSIS — R252 Cramp and spasm: Secondary | ICD-10-CM | POA: Insufficient documentation

## 2023-01-23 DIAGNOSIS — D509 Iron deficiency anemia, unspecified: Secondary | ICD-10-CM | POA: Insufficient documentation

## 2023-01-23 DIAGNOSIS — R634 Abnormal weight loss: Secondary | ICD-10-CM | POA: Insufficient documentation

## 2023-01-23 NOTE — Assessment & Plan Note (Signed)
Qualifies for low dose CT scan. Strongly encouraged; pt will consider, declines at this time.

## 2023-01-23 NOTE — Assessment & Plan Note (Signed)
Increasing water. Continue iron supplement. Monitor at this time.

## 2023-01-23 NOTE — Assessment & Plan Note (Signed)
Stool card negative.  Will have her update labs prior to next appt. She will continue on iron supplement.  Plan to check UA for micro blood as well. (Consider bladder with smoking status).

## 2023-01-23 NOTE — Assessment & Plan Note (Signed)
Patient reports that she is gaining weight by starting to eat better; reports that in the last year she was working through some meals; starting to be more intentional since our visit.

## 2023-01-24 ENCOUNTER — Encounter: Payer: Medicare Other | Admitting: Physician Assistant

## 2023-02-10 ENCOUNTER — Other Ambulatory Visit (INDEPENDENT_AMBULATORY_CARE_PROVIDER_SITE_OTHER): Payer: Medicare Other

## 2023-02-10 DIAGNOSIS — D509 Iron deficiency anemia, unspecified: Secondary | ICD-10-CM | POA: Diagnosis not present

## 2023-02-10 DIAGNOSIS — R5383 Other fatigue: Secondary | ICD-10-CM

## 2023-02-10 DIAGNOSIS — R634 Abnormal weight loss: Secondary | ICD-10-CM

## 2023-02-10 DIAGNOSIS — R252 Cramp and spasm: Secondary | ICD-10-CM

## 2023-02-10 LAB — URINALYSIS, ROUTINE W REFLEX MICROSCOPIC
Bilirubin Urine: NEGATIVE
Hgb urine dipstick: NEGATIVE
Ketones, ur: NEGATIVE
Leukocytes,Ua: NEGATIVE
Nitrite: NEGATIVE
RBC / HPF: NONE SEEN (ref 0–?)
Specific Gravity, Urine: 1.01 (ref 1.000–1.030)
Total Protein, Urine: NEGATIVE
Urine Glucose: NEGATIVE
Urobilinogen, UA: 0.2 (ref 0.0–1.0)
pH: 5.5 (ref 5.0–8.0)

## 2023-02-11 ENCOUNTER — Other Ambulatory Visit: Payer: Medicare Other

## 2023-02-11 DIAGNOSIS — D509 Iron deficiency anemia, unspecified: Secondary | ICD-10-CM

## 2023-02-11 DIAGNOSIS — R634 Abnormal weight loss: Secondary | ICD-10-CM

## 2023-02-11 DIAGNOSIS — R5383 Other fatigue: Secondary | ICD-10-CM

## 2023-02-11 DIAGNOSIS — R252 Cramp and spasm: Secondary | ICD-10-CM

## 2023-02-11 LAB — CBC WITH DIFFERENTIAL/PLATELET
Absolute Monocytes: 760 cells/uL (ref 200–950)
Basophils Absolute: 71 cells/uL (ref 0–200)
Basophils Relative: 1 %
Eosinophils Absolute: 99 cells/uL (ref 15–500)
Eosinophils Relative: 1.4 %
HCT: 41.1 % (ref 35.0–45.0)
Hemoglobin: 12.4 g/dL (ref 11.7–15.5)
Lymphs Abs: 1803 cells/uL (ref 850–3900)
MCH: 23.9 pg — ABNORMAL LOW (ref 27.0–33.0)
MCHC: 30.2 g/dL — ABNORMAL LOW (ref 32.0–36.0)
MCV: 79.2 fL — ABNORMAL LOW (ref 80.0–100.0)
Monocytes Relative: 10.7 %
Neutro Abs: 4367 cells/uL (ref 1500–7800)
Neutrophils Relative %: 61.5 %
Platelets: 322 10*3/uL (ref 140–400)
RBC: 5.19 10*6/uL — ABNORMAL HIGH (ref 3.80–5.10)
Total Lymphocyte: 25.4 %
WBC: 7.1 10*3/uL (ref 3.8–10.8)

## 2023-02-11 LAB — IRON,TIBC AND FERRITIN PANEL
%SAT: 72 % (calc) — ABNORMAL HIGH (ref 16–45)
Ferritin: 18 ng/mL (ref 16–288)
Iron: 282 ug/dL — ABNORMAL HIGH (ref 45–160)
TIBC: 391 mcg/dL (calc) (ref 250–450)

## 2023-02-14 ENCOUNTER — Encounter: Payer: Self-pay | Admitting: Physician Assistant

## 2023-02-14 ENCOUNTER — Ambulatory Visit (INDEPENDENT_AMBULATORY_CARE_PROVIDER_SITE_OTHER): Payer: Medicare Other | Admitting: Physician Assistant

## 2023-02-14 VITALS — BP 158/90 | HR 92 | Ht 60.0 in | Wt 120.0 lb

## 2023-02-14 DIAGNOSIS — Z720 Tobacco use: Secondary | ICD-10-CM

## 2023-02-14 DIAGNOSIS — D509 Iron deficiency anemia, unspecified: Secondary | ICD-10-CM | POA: Diagnosis not present

## 2023-02-14 DIAGNOSIS — R432 Parageusia: Secondary | ICD-10-CM

## 2023-02-14 DIAGNOSIS — R252 Cramp and spasm: Secondary | ICD-10-CM

## 2023-02-14 NOTE — Progress Notes (Unsigned)
Subjective:    Patient ID: Alyssa Love, female    DOB: 01/15/1951, 72 y.o.   MRN: [id number]  Chief Complaint  Patient presents with   lab resullts    HPI Patient is in today for recheck weight / lab discussion.   Taking iron supplement, time-released, 45 mg daily.  States she was craving ice in August 2023, a nurse friend told her anemia, didn't start taking iron until much later. Says she also had a time where she was very tired, just exhausted from walking her dog. Restless legs were terrible, had her up every 3-4 times in the night. Then she started taking iron supplement after January this year & began feeling better the last few months as well.   No change in bowel movements. Energy feels good. No blood in urine. No longer having restless leg syndrome or cramps in her legs.  Maintaining weight since last visit.  Also states that she has a change in taste in her mouth. Maybe more sweet taste ? Food just doesn't taste the same sometimes. Sweets are ok, [place] food is ok, fruits are ok. Fish / flounder recently was off. No recent COVID-19 infection.     Past Medical History:  Diagnosis Date   Allergy    very mild- pollen    Blood transfusion without reported diagnosis    35 yrs ago    Cataract    removed right  age 51, left 44    Chicken pox    Colon polyp    Complication of anesthesia    Hyperlipidemia    Left foot pain    PONV (postoperative nausea and vomiting)    Scoliosis    Smoker     Past Surgical History:  Procedure Laterality Date   CATARACT EXTRACTION, BILATERAL     age 82 and 59    CHOLECYSTECTOMY  11-27-53   COLONOSCOPY     CYST EXCISION Right 08/25/2018   Procedure: EXCISE CONJUNCTIVAL CYST;  Surgeon: Verne Carrow, MD;  Location: [facility];  Service: Ophthalmology;  Laterality: Right;   CYSTOSCOPY N/A 01/23/2020   Procedure: CYSTOSCOPY;  Surgeon: Huel Cote, MD;  Location: [facility];  Service:  Gynecology;  Laterality: N/A;   dental implant     EYE SURGERY     POLYPECTOMY     STRABISMUS SURGERY Left 08/25/2018   Procedure: REPAIR STRABISMUS LEFT EYE;  Surgeon: Verne Carrow, MD;  Location: [facility];  Service: Ophthalmology;  Laterality: Left;- revision 2020   TOTAL LAPAROSCOPIC HYSTERECTOMY WITH SALPINGECTOMY Bilateral 01/23/2020   Procedure: TOTAL LAPAROSCOPIC HYSTERECTOMY WITH BILATERAL SALPINGOOOPHORECTOMY, RESECTION OF PELVIC MASS;  Surgeon: Huel Cote, MD;  Location: [facility];  Service: Gynecology;  Laterality: Bilateral;   WISDOM TOOTH EXTRACTION      Family History  Problem Relation Age of Onset   Stroke Father 28   Prostate cancer Father    Hypertension Father    Lung cancer Mother    Colon polyps Mother    Cancer Maternal Grandmother    Colon cancer Maternal Grandmother    Cancer Paternal Grandmother    Heart attack Paternal Grandfather    Esophageal cancer Neg Hx    Rectal cancer Neg Hx    Stomach cancer Neg Hx     Social History   Tobacco Use   Smoking status: Every Day    Packs/day: 0.50    Years: 15.00    Additional pack years: 0.00  Total pack years: 7.50    Types: Cigarettes    Start date: 03/24/1968   Smokeless tobacco: Never  Vaping Use   Vaping Use: Never used  Substance Use Topics   Alcohol use: Yes    Comment: occasinally maybe once a month   Drug use: No     Allergies  Allergen Reactions   Codeine Other (See Comments)    Horrible dreams and hallucinations- She has had codeine cough meds with NO issues    Hydrocodone-Acetaminophen     REACTION: hallucinations    Review of Systems NEGATIVE UNLESS OTHERWISE INDICATED IN HPI      Objective:     BP (!) 158/90   Pulse 92   Ht 5' (1.524 m)   Wt 120 lb (54.4 kg)   SpO2 98%   BMI 23.44 kg/m   Wt Readings from Last 3 Encounters:  02/14/23 120 lb (54.4 kg)  01/21/23 120 lb (54.4 kg)  01/14/23 119 lb (54 kg)    BP Readings from  Last 3 Encounters:  02/14/23 (!) 158/90  01/14/23 128/74  01/21/22 (!) 156/80     Physical Exam Vitals and nursing note reviewed.  Constitutional:      Appearance: Normal appearance.  HENT:     Head: Normocephalic.  Eyes:     Extraocular Movements: Extraocular movements intact.     Conjunctiva/sclera: Conjunctivae normal.     Pupils: Pupils are equal, round, and reactive to light.  Cardiovascular:     Rate and Rhythm: Normal rate and regular rhythm.     Pulses: Normal pulses.  Pulmonary:     Effort: Pulmonary effort is normal.     Breath sounds: Normal breath sounds.  Musculoskeletal:     Right lower leg: No edema.     Left lower leg: No edema.  Lymphadenopathy:     Cervical: No cervical adenopathy.     Upper Body:     Right upper body: No supraclavicular adenopathy.     Left upper body: No supraclavicular adenopathy.  Neurological:     Mental Status: She is alert.  Psychiatric:        Mood and Affect: Mood normal.        Behavior: Behavior normal.        Assessment & Plan:  Iron deficiency anemia, unspecified iron deficiency anemia type Assessment & Plan:    Latest Ref Rng & Units 02/11/2023   10:46 AM 01/14/2023    1:27 PM 01/21/2022   12:37 PM  CBC  WBC 3.8 - 10.8 Thousand/uL 7.1  6.6  7.4   Hemoglobin 11.7 - 15.5 g/dL 09.8  11.9  14.7   Hematocrit 35.0 - 45.0 % 41.1  36.2  35.3   Platelets 140 - 400 Thousand/uL 322  343.0  265.0    Improving Continues to take iron supplement daily Stool card, urine test, both negative for blood Continue to monitor, recheck in approx 3 months  Orders: -     CBC with Differential/Platelet; Future -     IBC + Ferritin; Future  Leg cramps Assessment & Plan: Resolved with the addition of iron supplementation.   Orders: -     CBC with Differential/Platelet; Future -     IBC + Ferritin; Future  Altered taste Assessment & Plan: Went through differential with patient, most likely secondary to smoking use and aging; but  plan to keep an eye on things. Pt will keep me updated.    Tobacco abuse Assessment & Plan: Qualifies for  low dose CT scan. Again, strongly encouraged; pt will consider, declines at this time.         Return in about 3 months (around 05/17/2023) for LABS ONLY - CBC AND IRON PANEL .    [name] M Garrette Caine, PA-C

## 2023-02-15 NOTE — Assessment & Plan Note (Signed)
    Latest Ref Rng & Units 02/11/2023   10:46 AM 01/14/2023    1:27 PM 01/21/2022   12:37 PM  CBC  WBC 3.8 - 10.8 Thousand/uL 7.1  6.6  7.4   Hemoglobin 11.7 - 15.5 g/dL 16.1  09.6  04.5   Hematocrit 35.0 - 45.0 % 41.1  36.2  35.3   Platelets 140 - 400 Thousand/uL 322  343.0  265.0    Improving Continues to take iron supplement daily Stool card, urine test, both negative for blood Continue to monitor, recheck in approx 3 months

## 2023-02-15 NOTE — Assessment & Plan Note (Signed)
Went through differential with patient, most likely secondary to smoking use and aging; but plan to keep an eye on things. Pt will keep me updated.

## 2023-02-15 NOTE — Assessment & Plan Note (Signed)
Qualifies for low dose CT scan. Again, strongly encouraged; pt will consider, declines at this time.

## 2023-02-15 NOTE — Assessment & Plan Note (Signed)
Resolved with the addition of iron supplementation.

## 2023-05-17 ENCOUNTER — Other Ambulatory Visit: Payer: Medicare Other

## 2023-05-17 DIAGNOSIS — D509 Iron deficiency anemia, unspecified: Secondary | ICD-10-CM | POA: Diagnosis not present

## 2023-05-17 DIAGNOSIS — R252 Cramp and spasm: Secondary | ICD-10-CM

## 2023-05-17 LAB — CBC WITH DIFFERENTIAL/PLATELET
Basophils Absolute: 0.1 10*3/uL (ref 0.0–0.1)
Basophils Relative: 0.8 % (ref 0.0–3.0)
Eosinophils Absolute: 0.1 10*3/uL (ref 0.0–0.7)
Eosinophils Relative: 1 % (ref 0.0–5.0)
HCT: 44.9 % (ref 36.0–46.0)
Hemoglobin: 14.8 g/dL (ref 12.0–15.0)
Lymphocytes Relative: 23.9 % (ref 12.0–46.0)
Lymphs Abs: 1.7 10*3/uL (ref 0.7–4.0)
MCHC: 33 g/dL (ref 30.0–36.0)
MCV: 88.2 fl (ref 78.0–100.0)
Monocytes Absolute: 0.6 10*3/uL (ref 0.1–1.0)
Monocytes Relative: 8 % (ref 3.0–12.0)
Neutro Abs: 4.8 10*3/uL (ref 1.4–7.7)
Neutrophils Relative %: 66.3 % (ref 43.0–77.0)
Platelets: 249 10*3/uL (ref 150.0–400.0)
RBC: 5.08 Mil/uL (ref 3.87–5.11)
RDW: 16 % — ABNORMAL HIGH (ref 11.5–15.5)
WBC: 7.2 10*3/uL (ref 4.0–10.5)

## 2023-05-17 LAB — IBC + FERRITIN
Ferritin: 21.7 ng/mL (ref 10.0–291.0)
Iron: 117 ug/dL (ref 42–145)
Saturation Ratios: 30.4 % (ref 20.0–50.0)
TIBC: 385 ug/dL (ref 250.0–450.0)
Transferrin: 275 mg/dL (ref 212.0–360.0)

## 2024-05-28 ENCOUNTER — Ambulatory Visit: Payer: Self-pay

## 2024-05-28 NOTE — Telephone Encounter (Signed)
 FYI Only or Action Required?: Action required by provider: request for appointment.  Patient was last seen in primary care on NA.  Called Nurse Triage reporting Sinusitis.  Symptoms began several days ago.  Interventions attempted: Other: inhaler, cough med, antibiotic, nasal wash.  Symptoms are: unchanged.  Triage Disposition: See PCP When Office is Open (Within 3 Days)  Patient/caregiver understands and will follow disposition?:      Copied from CRM #[id number]. Topic: Clinical - Red Word Triage >> May 28, 2024  4:18 PM Lavanda D wrote: Red Word that prompted transfer to Nurse Triage: Respiratory and sinus infection which has not improved in a week. Discoloration of mucus (yellowish)/chills but no temp. Reason for Disposition  [1] Sinus congestion (pressure, fullness) AND [2] present > 10 days  Answer Assessment - Initial Assessment Questions Offered appt today, patient declined, appt scheduled for tomorrow. Advised UC/ ED if symptoms worsen.  Patient reports productive cough, nasal congestion/drainage and sinus pressure, intermittent chills, since last Wednesday. Denies fever, difficulty breathing, HA, sore throat, dizziness, n/v/d.   Patient reports seen in urgent care last Wednesday; prescribed Azithromycin, cough med and inhaler; still not feeling well. U  1. LOCATION: Where does it hurt?      No pain 2. ONSET: When did the sinus pain start?  (e.g., hours, days)      1 week 3. SEVERITY: How bad is the pain?   (Scale 0-10; or none, mild, moderate or severe)     0/10  4. NASAL CONGESTION: Is the nose blocked? If Yes, ask: Can you open it or must you breathe through your mouth?    yes 5. NASAL DISCHARGE: Do you have discharge from your nose? If so ask, What color?     yellow 6. FEVER: Do you have a fever? If Yes, ask: What is it, how was it measured, and when did it start?      Last checked temp yesterday 97.3 7. OTHER SYMPTOMS: Do you have any other  symptoms? (e.g., sore throat, cough, earache, difficulty breathing)     Productive cough;clear, yellow nasal drainage,  Protocols used: Sinus Pain or Congestion-A-AH

## 2024-05-29 ENCOUNTER — Ambulatory Visit: Admitting: Family Medicine

## 2024-05-29 ENCOUNTER — Encounter: Payer: Self-pay | Admitting: Family Medicine

## 2024-05-29 VITALS — BP 143/79 | HR 82 | Temp 97.8°F | Resp 18 | Ht 60.0 in | Wt 124.0 lb

## 2024-05-29 DIAGNOSIS — J40 Bronchitis, not specified as acute or chronic: Secondary | ICD-10-CM | POA: Diagnosis not present

## 2024-05-29 MED ORDER — METHYLPREDNISOLONE ACETATE 80 MG/ML IJ SUSP
80.0000 mg | Freq: Once | INTRAMUSCULAR | Status: AC
  Administered 2024-05-29: 80 mg via INTRAMUSCULAR

## 2024-05-29 MED ORDER — CEFDINIR 300 MG PO CAPS: 300.0000 mg | ORAL_CAPSULE | Freq: Two times a day (BID) | ORAL | 0 refills | Status: AC

## 2024-05-29 NOTE — Telephone Encounter (Signed)
 Noted pt appt has been scheduled

## 2024-05-29 NOTE — Progress Notes (Signed)
 Subjective:     Patient ID: Alyssa Love, female    DOB: July 31, 1951, 73 y.o.   MRN: [id number]  Chief Complaint  Patient presents with   Sinus Pressure    Started last week, went to UC on Wednesday   Cough    Productive cough with yellow mucus that started last week Using netty pot    HPI Discussed the use of AI scribe software for clinical note transcription with the patient, who gave verbal consent to proceed.  History of Present Illness Alyssa Love is a 73 year old female who presents with persistent respiratory symptoms despite prior treatment.  She has been experiencing respiratory symptoms for approximately 10 days, beginning with rhinorrhea, nasal congestion, and cough. She initially sought care at urgent care on May 23, 2024, where she was prescribed azithromycin (Z-Pak), cough medicine, and an inhaler. Despite this treatment, her symptoms have worsened, particularly with thick mucus production that causes nausea. She uses a neti pot twice daily, which helps clear nasal mucus.  Initially, she experienced dyspnea, which has since improved. No fever, with her temperature consistently around 46F. She also reports sinus pressure, although facial pain has decreased compared to last week. She feels lethargic and wants to sleep all day. She has not been tested for COVID-19.  She received a Depo shot at urgent care, which she feels did not improve her symptoms, unlike previous experiences with the shot.  She smokes occasionally but has not smoked recently.    Health Maintenance Due  Topic Date Due   Lung Cancer Screening  Never done   Medicare Annual Wellness (AWV)  01/23/2022   MAMMOGRAM  12/10/2023   Colonoscopy  04/04/2024   INFLUENZA VACCINE  05/04/2024    Past Medical History:  Diagnosis Date   Allergy    very mild- pollen    Blood transfusion without reported diagnosis    35 yrs ago    Cataract    removed right  age 50, left 44     Chicken pox    Colon polyp    Complication of anesthesia    Hyperlipidemia    Left foot pain    PONV (postoperative nausea and vomiting)    Scoliosis    Smoker     Past Surgical History:  Procedure Laterality Date   CATARACT EXTRACTION, BILATERAL     age 48 and 65    CHOLECYSTECTOMY  11-27-53   COLONOSCOPY     CYST EXCISION Right 08/25/2018   Procedure: EXCISE CONJUNCTIVAL CYST;  Surgeon: Neysa Fallow, MD;  Location: [facility];  Service: Ophthalmology;  Laterality: Right;   CYSTOSCOPY N/A 01/23/2020   Procedure: CYSTOSCOPY;  Surgeon: Estelle Service, MD;  Location: [facility];  Service: Gynecology;  Laterality: N/A;   dental implant     EYE SURGERY     POLYPECTOMY     STRABISMUS SURGERY Left 08/25/2018   Procedure: REPAIR STRABISMUS LEFT EYE;  Surgeon: Neysa Fallow, MD;  [facility];  Service: Ophthalmology;  Laterality: Left;- revision 2020   TOTAL LAPAROSCOPIC HYSTERECTOMY WITH SALPINGECTOMY Bilateral 01/23/2020   Procedure: TOTAL LAPAROSCOPIC HYSTERECTOMY WITH BILATERAL SALPINGOOOPHORECTOMY, RESECTION OF PELVIC MASS;  Surgeon: Estelle Service, MD;  Location: [facility];  Service: Gynecology;  Laterality: Bilateral;   WISDOM TOOTH EXTRACTION       Current Outpatient Medications:    albuterol (VENTOLIN HFA) 108 (90 Base) MCG/ACT inhaler, Inhale into the lungs., Disp: , Rfl:  cefdinir  (OMNICEF ) 300 MG capsule, Take 1 capsule (300 mg total) by mouth 2 (two) times daily., Disp: 14 capsule, Rfl: 0   promethazine -dextromethorphan (PROMETHAZINE -DM) 6.25-15 MG/5ML syrup, Take by mouth 4 (four) times daily as needed for cough., Disp: , Rfl:   Allergies  Allergen Reactions   Codeine Other (See Comments)    Horrible dreams and hallucinations- She has had codeine cough meds with NO issues    Hydrocodone-Acetaminophen      REACTION: hallucinations   ROS neg/noncontributory except as noted HPI/below       Objective:     BP (!) 143/79   Pulse 82   Temp 97.8 F (36.6 C) (Temporal)   Resp 18   Ht 5' (1.524 m)   Wt 124 lb (56.2 kg)   SpO2 95%   BMI 24.22 kg/m  Wt Readings from Last 3 Encounters:  05/29/24 124 lb (56.2 kg)  02/14/23 120 lb (54.4 kg)  01/21/23 120 lb (54.4 kg)    Physical Exam   Gen: WDWN NAD HEENT: NCAT, conjunctiva not injected, sclera nonicteric TM WNL B, OP moist, no exudates  sinuses NT NECK:  supple, no thyromegaly, no nodes, CARDIAC: RRR, S1S2+, no murmur.  LUNGS: CTAB. No wheezes EXT:  no edema MSK: no gross abnormalities. X scoliosis NEURO: A&O x3.  CN II-XII intact.  PSYCH: normal mood. Good eye contact     Assessment & Plan:  Bronchitis -     methylPREDNISolone  Acetate  Other orders -     Cefdinir ; Take 1 capsule (300 mg total) by mouth 2 (two) times daily.  Dispense: 14 capsule; Refill: 0  Assessment and Plan Assessment & Plan Acute bronchitis   She presents with acute bronchitis, experiencing persistent symptoms such as thick mucus production, sinus pressure, and lethargy. Initial treatment with azithromycin was ineffective, likely due to a viral cause or premature antibiotic use. Differential diagnosis includes viral versus bacterial bronchitis. No fever is present, and her shortness of breath has improved. Prescribe cefdinir  and administer a Depo steroid shot. Use an inhaler as needed for wheezing, difficulty breathing, or severe coughing. Monitor symptoms and seek further evaluation if she worsens or does not improve.  Tobacco use   She reports current tobacco use but has not smoked recently. Smoking cessation is advised to aid recovery from bronchitis and improve overall respiratory health.    Return if symptoms worsen or fail to improve.  Jenkins CHRISTELLA Carrel, MD

## 2024-05-29 NOTE — Patient Instructions (Signed)
 It was very nice to see you today!  Worse let us  know.   PLEASE NOTE:  If you had any lab tests please let us  know if you have not heard back within a few days. You may see your results on MyChart before we have a chance to review them but we will give you a call once they are reviewed by us . If we ordered any referrals today, please let us  know if you have not heard from their office within the next week.   Please try these tips to maintain a healthy lifestyle:  Eat most of your calories during the day when you are active. Eliminate processed foods including packaged sweets (pies, cakes, cookies), reduce intake of potatoes, white bread, white pasta, and white rice. Look for whole grain options, oat flour or almond flour.  Each meal should contain half fruits/vegetables, one quarter protein, and one quarter carbs (no bigger than a computer mouse).  Cut down on sweet beverages. This includes juice, soda, and sweet tea. Also watch fruit intake, though this is a healthier sweet option, it still contains natural sugar! Limit to 3 servings daily.  Drink at least 1 glass of water with each meal and aim for at least 8 glasses per day  Exercise at least 150 minutes every week.
